# Patient Record
Sex: Male | Born: 1980 | Hispanic: No | Marital: Married | Smoking: Never smoker
Health system: Southern US, Community
[De-identification: ages and names within clinical notes are randomized; demographics above are authoritative.]

## PROBLEM LIST (undated history)

## (undated) ENCOUNTER — Emergency Department (HOSPITAL_BASED_OUTPATIENT_CLINIC_OR_DEPARTMENT_OTHER): Admission: EM | Payer: BC Managed Care – PPO

## (undated) DIAGNOSIS — F339 Major depressive disorder, recurrent, unspecified: Secondary | ICD-10-CM

## (undated) DIAGNOSIS — K219 Gastro-esophageal reflux disease without esophagitis: Secondary | ICD-10-CM

## (undated) DIAGNOSIS — M4802 Spinal stenosis, cervical region: Secondary | ICD-10-CM

## (undated) HISTORY — DX: Major depressive disorder, recurrent, unspecified: F33.9

## (undated) HISTORY — DX: Gastro-esophageal reflux disease without esophagitis: K21.9

## (undated) HISTORY — DX: Spinal stenosis, cervical region: M48.02

---

## 2016-12-20 HISTORY — PX: ANTERIOR CERVICAL DECOMP/DISCECTOMY FUSION: SHX1161

## 2017-07-31 ENCOUNTER — Encounter: Payer: Self-pay | Admitting: Family Medicine

## 2017-07-31 ENCOUNTER — Ambulatory Visit (INDEPENDENT_AMBULATORY_CARE_PROVIDER_SITE_OTHER): Payer: BLUE CROSS/BLUE SHIELD | Admitting: Family Medicine

## 2017-07-31 DIAGNOSIS — F329 Major depressive disorder, single episode, unspecified: Secondary | ICD-10-CM | POA: Diagnosis not present

## 2017-07-31 DIAGNOSIS — M5010 Cervical disc disorder with radiculopathy, unspecified cervical region: Secondary | ICD-10-CM

## 2017-07-31 DIAGNOSIS — M542 Cervicalgia: Secondary | ICD-10-CM | POA: Diagnosis not present

## 2017-07-31 MED ORDER — PREDNISONE 10 MG PO TABS: ORAL_TABLET | ORAL | 0 refills | Status: DC

## 2017-07-31 MED FILL — predniSONE 10 MG TABS: 10 | 6 days supply | Qty: 21 | Fill #0

## 2017-07-31 NOTE — Patient Instructions (Addendum)
I'm concerned about you having a C7 radiculopathy. Take prednisone dose pack as directed for 6 days. Call me in a week to let me know how you're doing. Keep working with [place]. We will obtain the records from your other doctor. Continue the cymbalta, gabapentin and robaxin. If you have paperwork that needs to be filled out, drop this off. Establish care with [facility] so you have a primary care physician. It would also benefit you to work with one of their psychologists. If you feel like you're going to hurt yourself please call 911, go to the emergency department, call me.

## 2017-08-03 ENCOUNTER — Encounter: Payer: Self-pay | Admitting: Family Medicine

## 2017-08-03 DIAGNOSIS — M5010 Cervical disc disorder with radiculopathy, unspecified cervical region: Secondary | ICD-10-CM | POA: Insufficient documentation

## 2017-08-03 DIAGNOSIS — F329 Major depressive disorder, single episode, unspecified: Secondary | ICD-10-CM | POA: Insufficient documentation

## 2017-08-03 DIAGNOSIS — F32A Depression, unspecified: Secondary | ICD-10-CM | POA: Insufficient documentation

## 2017-08-03 NOTE — Assessment & Plan Note (Signed)
ROI filled out to obtain records from his neck surgeon and imaging reports.  Continue cymbalta, gabapentin, and robaxin.  Start prednisone dose pack, continue PT.  May need to repeat MRI of cervical spine but will see depending on records.

## 2017-08-03 NOTE — Assessment & Plan Note (Signed)
patient is taking cymbalta, gabapentin.  He endorsed feelings of SI - denied having a plan.  Taken to establish care with PCP across the hall - they also have psychology there for counseling.  Contracted to contact me, 911, seek care in ED if he feels like he's going to hurt himself.

## 2017-08-03 NOTE — Progress Notes (Signed)
PCP: James Sullivan, No Pcp Per  Subjective:   HPI: James Sullivan is a 37 y.o. male here for neck pain.  James Sullivan moved here from [place] about 1 month ago. He states approximately 1 year ago in August 2018 he had some pain in left side of neck when golfing. He developed pain radiating down his arm into thumb, index, middle fingers with numbness only on left. He had tried NSAIDs, ben gay. Tried PT after this then ESIs without improvement. Had surgery - some records available - likely C5-6 level fusion. ROI filled out today to get remainder of records. He lost his voice after surgery and needed speech therapy. Suffering from depression and insomnia. He's taking cymbalta 60mg  daily, gabapentin 600 tid, robaxin 750 tid. Still having pain left side of neck and upper back with radiation and numbness into his left index finger. Has done two visits in PT. Recent numbness in right 5th digit as well. No bowel/bladder dysfunction.  History reviewed. No pertinent past medical history.  No current outpatient medications on file prior to visit.   No current facility-administered medications on file prior to visit.     History reviewed. No pertinent surgical history.  No Known Allergies  Social History   Socioeconomic History  . Marital status: Married    Spouse name: Not on file  . Number of children: Not on file  . Years of education: Not on file  . Highest education level: Not on file  Occupational History  . Not on file  Social Needs  . Financial resource strain: Not on file  . Food insecurity:    Worry: Not on file    Inability: Not on file  . Transportation needs:    Medical: Not on file    Non-medical: Not on file  Tobacco Use  . Smoking status: Never Smoker  . Smokeless tobacco: Never Used  Substance and Sexual Activity  . Alcohol use: Not on file  . Drug use: Not on file  . Sexual activity: Not on file  Lifestyle  . Physical activity:    Days per week: Not on file    Minutes  per session: Not on file  . Stress: Not on file  Relationships  . Social connections:    Talks on phone: Not on file    Gets together: Not on file    Attends religious service: Not on file    Active member of club or organization: Not on file    Attends meetings of clubs or organizations: Not on file    Relationship status: Not on file  . Intimate partner violence:    Fear of current or ex partner: Not on file    Emotionally abused: Not on file    Physically abused: Not on file    Forced sexual activity: Not on file  Other Topics Concern  . Not on file  Social History Narrative  . Not on file    History reviewed. No pertinent family history.  BP 100/66   Pulse 63   Ht 5\' 6"  (1.676 m)   Wt 154 lb 6.4 oz (70 kg)   BMI 24.92 kg/m   Review of Systems: See HPI above.     Objective:  Physical Exam:  Gen: NAD, comfortable in exam room  Neck: No gross deformity, swelling, bruising. TTP left cervical paraspinal region and diffusely posterior left shoulder.  No midline/bony TTP. ROM limited to 10 degrees left lat rotation, 20 right lat rotation, 10 flexion, 5 extension. BUE strength  5/5 except 4/5 and painful with wrist extension, elbow flexion and extension, limited by pain.   Sensation diminished to light touch left index finger, right 5th digit - no other lack of sensation. 2+ equal reflexes in triceps, biceps, brachioradialis tendons. 2+ radial pulses.  Left shoulder: No swelling, ecchymoses.  No gross deformity. No TTP. FROM. Strength 5/5 with resisted internal/external rotation. NV intact distally.   Assessment & Plan:  1. Neck pain with radiation into left arm - 2/2 cervical radiculopathy.  ROI filled out to obtain records from his neck surgeon and imaging reports.  Continue cymbalta, gabapentin, and robaxin.  Start prednisone dose pack, continue PT.  May need to repeat MRI of cervical spine but will see depending on records.  2. Depression - James Sullivan is taking  cymbalta, gabapentin.  He endorsed feelings of SI - denied having a plan.  Taken to establish care with PCP across the hall - they also have psychology there for counseling.  Contracted to contact me, 911, seek care in ED if he feels like he's going to hurt himself.

## 2017-08-05 ENCOUNTER — Telehealth: Payer: Self-pay | Admitting: Family Medicine

## 2017-08-05 NOTE — Telephone Encounter (Signed)
Called to inform patient of his records being faxed to [facility] per patient request and patient wanted to update you that he is still in pain. He states he has not seen relief from the prednisone and it is causing him to lose sleep. He is taking melatonin to try and help him sleep.

## 2017-08-05 NOTE — Telephone Encounter (Signed)
Patient was informed and will call back after he finishes the prednisone

## 2017-08-05 NOTE — Telephone Encounter (Signed)
That's disappointing.  I'd like to see his records which we haven't received yet but it's likely we will have to repeat his imaging.  Concern is he has a pinched nerve below the level where he had surgery.  Ask him to finish out his prednisone.  Continue cymbalta, gabapentin, robaxin.  He can use salon pas patches and capsaicin too (both things he can get over the counter).

## 2017-08-20 ENCOUNTER — Encounter: Payer: Self-pay | Admitting: Family Medicine

## 2017-08-20 ENCOUNTER — Ambulatory Visit (INDEPENDENT_AMBULATORY_CARE_PROVIDER_SITE_OTHER): Payer: BLUE CROSS/BLUE SHIELD | Admitting: Family Medicine

## 2017-08-20 VITALS — BP 124/66 | HR 81 | Temp 98.2°F | Ht 66.0 in | Wt 156.2 lb

## 2017-08-20 DIAGNOSIS — F331 Major depressive disorder, recurrent, moderate: Secondary | ICD-10-CM | POA: Insufficient documentation

## 2017-08-20 DIAGNOSIS — M4802 Spinal stenosis, cervical region: Secondary | ICD-10-CM | POA: Insufficient documentation

## 2017-08-20 MED ORDER — OMEPRAZOLE 40 MG PO CPDR: 40.0000 mg | DELAYED_RELEASE_CAPSULE | Freq: Every day | ORAL | 1 refills | Status: DC

## 2017-08-20 MED ORDER — METHOCARBAMOL 750 MG PO TABS
750.0000 mg | ORAL_TABLET | Freq: Three times a day (TID) | ORAL | 1 refills | Status: DC | PRN
Start: 2017-08-20 — End: 2017-12-09

## 2017-08-20 MED ORDER — GABAPENTIN 600 MG PO TABS: 600.0000 mg | ORAL_TABLET | Freq: Every day | ORAL | 1 refills | Status: DC

## 2017-08-20 MED ORDER — ESCITALOPRAM OXALATE 10 MG PO TABS: 10.0000 mg | ORAL_TABLET | Freq: Every day | ORAL | 3 refills | Status: DC

## 2017-08-20 NOTE — Patient Instructions (Signed)
Please consider counseling. Contact [phone] to schedule an appointment or inquire about cost/insurance coverage.  Stay active. Yoga. Water therapy.  Ice/cold pack over area for 10-15 min twice daily.  Heat (pad or rice pillow in microwave) over affected area, 10-15 minutes twice daily.   Let us know if you need anything.  Trapezius stretches/exercises Do exercises exactly as told by your health care provider and adjust them as directed. It is normal to feel mild stretching, pulling, tightness, or discomfort as you do these exercises, but you should stop right away if you feel sudden pain or your pain gets worse.  Stretching and range of motion exercises These exercises warm up your muscles and joints and improve the movement and flexibility of your shoulder. These exercises can also help to relieve pain, numbness, and tingling. If you are unable to do any of the following for any reason, do not further attempt to do it.   Exercise A: Flexion, standing    1. Stand and hold a broomstick, a cane, or a similar object. Place your hands a little more than shoulder-width apart on the object. Your left / right hand should be palm-up, and your other hand should be palm-down. 2. Push the stick to raise your left / right arm out to your side and then over your head. Use your other hand to help move the stick. Stop when you feel a stretch in your shoulder, or when you reach the angle that is recommended by your health care provider. ? Avoid shrugging your shoulder while you raise your arm. Keep your shoulder blade tucked down toward your spine. 3. Hold for 30 seconds. 4. Slowly return to the starting position. Repeat 2 times. Complete this exercise 3 times per week.  Exercise B: Abduction, supine    1. Lie on your back and hold a broomstick, a cane, or a similar object. Place your hands a little more than shoulder-width apart on the object. Your left / right hand should be palm-up, and your  other hand should be palm-down. 2. Push the stick to raise your left / right arm out to your side and then over your head. Use your other hand to help move the stick. Stop when you feel a stretch in your shoulder, or when you reach the angle that is recommended by your health care provider. ? Avoid shrugging your shoulder while you raise your arm. Keep your shoulder blade tucked down toward your spine. 3. Hold for 30 seconds. 4. Slowly return to the starting position. Repeat 2 times. Complete this exercise 3 times per week.  Exercise C: Flexion, active-assisted    1. Lie on your back. You may bend your knees for comfort. 2. Hold a broomstick, a cane, or a similar object. Place your hands about shoulder-width apart on the object. Your palms should face toward your feet. 3. Raise the stick and move your arms over your head and behind your head, toward the floor. Use your healthy arm to help your left / right arm move farther. Stop when you feel a gentle stretch in your shoulder, or when you reach the angle where your health care provider tells you to stop. 4. Hold for 30 seconds. 5. Slowly return to the starting position. Repeat 2 times. Complete this exercise 3 times per week.  Exercise D: External rotation and abduction    1. Stand in a door frame with one of your feet slightly in front of the other. This is called a staggered  stance. 2. Choose one of the following positions as told by your health care provider: ? Place your hands and forearms on the door frame above your head. ? Place your hands and forearms on the door frame at the height of your head. ? Place your hands on the door frame at the height of your elbows. 3. Slowly move your weight onto your front foot until you feel a stretch across your chest and in the front of your shoulders. Keep your head and chest upright and keep your abdominal muscles tight. 4. Hold for 30 seconds. 5. To release the stretch, shift your weight to  your back foot. Repeat 2 times. Complete this stretch 3 times per week.  Strengthening exercises These exercises build strength and endurance in your shoulder. Endurance is the ability to use your muscles for a long time, even after your muscles get tired. Exercise E: Scapular depression and adduction  1. Sit on a stable chair. Support your arms in front of you with pillows, armrests, or a tabletop. Keep your elbows in line with the sides of your body. 2. Gently move your shoulder blades down toward your middle back. Relax the muscles on the tops of your shoulders and in the back of your neck. 3. Hold for 3 seconds. 4. Slowly release the tension and relax your muscles completely before doing this exercise again. Repeat for a total of 10 repetitions. 5. After you have practiced this exercise, try doing the exercise without the arm support. Then, try the exercise while standing instead of sitting. Repeat 2 times. Complete this exercise 3 times per week.  Exercise F: Shoulder abduction, isometric    1. Stand or sit about 4-6 inches (10-15 cm) from a wall with your left / right side facing the wall. 2. Bend your left / right elbow and gently press your elbow against the wall. 3. Increase the pressure slowly until you are pressing as hard as you can without shrugging your shoulder. 4. Hold for 3 seconds. 5. Slowly release the tension and relax your muscles completely. Repeat for a total of 10 repetitions. Repeat 2 times. Complete this exercise 3 times per week.  Exercise G: Shoulder flexion, isometric    1. Stand or sit about 4-6 inches (10-15 cm) away from a wall with your left / right side facing the wall. 2. Keep your left / right elbow straight and gently press the top of your fist against the wall. Increase the pressure slowly until you are pressing as hard as you can without shrugging your shoulder. 3. Hold for 10-15 seconds. 4. Slowly release the tension and relax your muscles  completely. Repeat for a total of 10 repetitions. Repeat 2 times. Complete this exercise 3 times per week.  Exercise H: Internal rotation    1. Sit in a stable chair without armrests, or stand. Secure an exercise band at your left / right side, at elbow height. 2. Place a soft object, such as a folded towel or a small pillow, under your left / right upper arm so your elbow is a few inches (about 8 cm) away from your side. 3. Hold the end of the exercise band so the band stretches. 4. Keeping your elbow pressed against the soft object under your arm, move your forearm across your body toward your abdomen. Keep your body steady so the movement is only coming from your shoulder. 5. Hold for 3 seconds. 6. Slowly return to the starting position. Repeat for a total  of 10 repetitions. Repeat 2 times. Complete this exercise 3 times per week.  Exercise I: External rotation    1. Sit in a stable chair without armrests, or stand. 2. Secure an exercise band at your left / right side, at elbow height. 3. Place a soft object, such as a folded towel or a small pillow, under your left / right upper arm so your elbow is a few inches (about 8 cm) away from your side. 4. Hold the end of the exercise band so the band stretches. 5. Keeping your elbow pressed against the soft object under your arm, move your forearm out, away from your abdomen. Keep your body steady so the movement is only coming from your shoulder. 6. Hold for 3 seconds. 7. Slowly return to the starting position. Repeat for a total of 10 repetitions. Repeat 2 times. Complete this exercise 3 times per week. Exercise J: Shoulder extension  1. Sit in a stable chair without armrests, or stand. Secure an exercise band to a stable object in front of you so the band is at shoulder height. 2. Hold one end of the exercise band in each hand. Your palms should face each other. 3. Straighten your elbows and lift your hands up to shoulder  height. 4. Step back, away from the secured end of the exercise band, until the band stretches. 5. Squeeze your shoulder blades together and pull your hands down to the sides of your thighs. Stop when your hands are straight down by your sides. Do not let your hands go behind your body. 6. Hold for 3 seconds. 7. Slowly return to the starting position. Repeat for a total of 10 repetitions. Repeat 2 times. Complete this exercise 3 times per week.  Exercise K: Shoulder extension, prone    1. Lie on your abdomen on a firm surface so your left / right arm hangs over the edge. 2. Hold a 5 lb weight in your hand so your palm faces in toward your body. Your arm should be straight. 3. Squeeze your shoulder blade down toward the middle of your back. 4. Slowly raise your arm behind you, up to the height of the surface that you are lying on. Keep your arm straight. 5. Hold for 3 seconds. 6. Slowly return to the starting position and relax your muscles. Repeat for a total of 10 repetitions. Repeat 2 times. Complete this exercise 3 times per week.   Exercise L: Horizontal abduction, prone  1. Lie on your abdomen on a firm surface so your left / right arm hangs over the edge. 2. Hold a 5 lb weight in your hand so your palm faces toward your feet. Your arm should be straight. 3. Squeeze your shoulder blade down toward the middle of your back. 4. Bend your elbow so your hand moves up, until your elbow is bent to an "L" shape (90 degrees). With your elbow bent, slowly move your forearm forward and up. Raise your hand up to the height of the surface that you are lying on. ? Your upper arm should not move, and your elbow should stay bent. ? At the top of the movement, your palm should face the floor. 5. Hold for 3 seconds. 6. Slowly return to the starting position and relax your muscles. Repeat for a total of 10 repetitions. Repeat 2 times. Complete this exercise 3 times per week.  Exercise M: Horizontal  abduction, standing  1. Sit on a stable chair, or stand. 2. Secure  an exercise band to a stable object in front of you so the band is at shoulder height. 3. Hold one end of the exercise band in each hand. 4. Straighten your elbows and lift your hands straight in front of you, up to shoulder height. Your palms should face down, toward the floor. 5. Step back, away from the secured end of the exercise band, until the band stretches. 6. Move your arms out to your sides, and keep your arms straight. 7. Hold for 3 seconds. 8. Slowly return to the starting position. Repeat for a total of 10 repetitions. Repeat 2 times. Complete this exercise 3 times per week.  Exercise N: Scapular retraction and elevation  1. Sit on a stable chair, or stand. 2. Secure an exercise band to a stable object in front of you so the band is at shoulder height. 3. Hold one end of the exercise band in each hand. Your palms should face each other. 4. Sit in a stable chair without armrests, or stand. 5. Step back, away from the secured end of the exercise band, until the band stretches. 6. Squeeze your shoulder blades together and lift your hands over your head. Keep your elbows straight. 7. Hold for 3 seconds. 8. Slowly return to the starting position. Repeat for a total of 10 repetitions. Repeat 2 times. Complete this exercise 3 times per week.  This information is not intended to replace advice given to you by your health care provider. Make sure you discuss any questions you have with your health care provider. Document Released: 01/06/2005 Document Revised: 09/13/2015 Document Reviewed: 11/23/2014 Elsevier Interactive Patient Education  2017 [employer].

## 2017-08-20 NOTE — Progress Notes (Signed)
Chief Complaint  Patient presents with  . New Patient (Initial Visit)       New Patient Visit SUBJECTIVE: HPI: James Sullivan is an 37 y.o.male who is being seen for establishing care.  The patient was previously seen at office in [place].  Hx of severe c spine stenosis. Failed conservative therapy, eventually had a laminectomy that helped a little. Having a lot of upper axial pain and LUE weakness/numbness/tingling/pain. Was told this could persist up to 18 mo post op. It has been aorund 8 mo since surg. Failed tramadol, hydrocodone, Cymbalta. Currently on Robaxin, Gabapentin. Going thru PT. Not following with NS in area. Requesting MRI for follow up given persistent weakness/numbness/pain.   Hx of depression also.    No Known Allergies  Past Medical History:  Diagnosis Date  . GERD (gastroesophageal reflux disease)    History reviewed. No pertinent surgical history.   Family History  Problem Relation Age of Onset  . Narcolepsy Mother   . Anxiety disorder Mother   . Depression Mother    No Known Allergies  Current Outpatient Medications:  Marland Kitchen  Melatonin 10 MG CAPS, Take 1 capsule by mouth at bedtime., Disp: , Rfl:  .  methocarbamol (ROBAXIN) 750 MG tablet, Take 1 tablet (750 mg total) by mouth every 8 (eight) hours as needed for muscle spasms. Patient currently taking twice daily, Disp: 180 tablet, Rfl: 1 .  omeprazole (PRILOSEC) 40 MG capsule, Take 1 capsule (40 mg total) by mouth daily., Disp: 90 capsule, Rfl: 1 .  escitalopram (LEXAPRO) 10 MG tablet, Take 1 tablet (10 mg total) by mouth daily., Disp: 30 tablet, Rfl: 3 .  gabapentin (NEURONTIN) 600 MG tablet, Take 1 tablet (600 mg total) by mouth at bedtime., Disp: 270 tablet, Rfl: 1  ROS 10 pt ROS neg unless otherwise noted in HPI  OBJECTIVE: BP 124/66 (BP Location: Right Arm, Patient Position: Sitting, Cuff Size: Normal)   Pulse 81   Temp 98.2 F (36.8 C) (Oral)   Ht 5\' 6"  (1.676 m)   Wt 156 lb 4 oz (70.9 kg)    SpO2 97%   BMI 25.22 kg/m   Constitutional: -  VS reviewed -  Well developed, well nourished, appears stated age -  No apparent distress  Psychiatric: -  Oriented to person, place, and time -  Memory intact -  Flat affect, dysthymic mood -  Fluent conversation, good eye contact -  Judgment and insight age appropriate  Eye: -  Conjunctivae clear, no discharge -  Pupils symmetric, round, reactive to light  ENMT: -  MMM    Pharynx moist, no exudate, no erythema  Neck: -  No gross swelling, no palpable masses -  Thyroid midline, not enlarged, mobile, no palpable masses  Cardiovascular: -  RRR -  No LE edema  Respiratory: -  Normal respiratory effort, no accessory muscle use, no retraction -  Breath sounds equal, no wheezes, no ronchi, no crackles  Gastrointestinal: -  Bowel sounds normal -  No tenderness, no distention, no guarding, no masses  Neurological:  -  CN II - XII grossly intact -  Decreased sensation to pinprick over L hand, especially 2nd digit.  -  No cerebellar signs -  Neg Spurling's though it did elicit pain -  4/5 strength with ext/int rotation of shoulder and flex/ext of elbow on L -  5/5 strength on RUE  Musculoskeletal: -  No clubbing, no cyanosis -  Gait normal -  +TTP over traps and cerv  paraspinal msc b/l  Skin: -  No significant lesion on inspection -  Warm and dry to palpation   ASSESSMENT/PLAN: Moderate episode of recurrent major depressive disorder (HCC) - Plan: Melatonin 10 MG CAPS, escitalopram (LEXAPRO) 10 MG tablet  Cervical stenosis of spinal canal - Plan: methocarbamol (ROBAXIN) 750 MG tablet, gabapentin (NEURONTIN) 600 MG tablet, MR Cervical Spine Wo Contrast  Patient instructed to sign release of records form from his previous PCP. Will ck on MRI. Offered referral to NS, pt would like to await results as finances are tight with his many specialists. I will stop Cymbalta for him as it did not seem to be particularly helpful.  # for [facility]  provided. Might look into what options are offered by insurance as finances are an issue. Heat, ice, stretches/exercises for traps.  Patient should return in 6 weeks. The patient voiced understanding and agreement to the plan.   Jilda Rocheicholas Paul [place], DO 08/20/17  3:36 PM

## 2017-08-20 NOTE — Progress Notes (Signed)
Pre visit review using our clinic review tool, if applicable. No additional management support is needed unless otherwise documented below in the visit note. 

## 2017-08-21 ENCOUNTER — Encounter: Payer: Self-pay | Admitting: Family Medicine

## 2017-08-21 DIAGNOSIS — K219 Gastro-esophageal reflux disease without esophagitis: Secondary | ICD-10-CM | POA: Insufficient documentation

## 2017-08-21 DIAGNOSIS — F339 Major depressive disorder, recurrent, unspecified: Secondary | ICD-10-CM | POA: Insufficient documentation

## 2017-08-21 DIAGNOSIS — M542 Cervicalgia: Secondary | ICD-10-CM

## 2017-08-21 NOTE — Progress Notes (Unsigned)
Extensive notes received from patient's providers in [facility] and have been reviewed, summary with dates below:  09/04/16:  Office visit with Dr. Haywood Pao, orthopedic surgeon.  Pain, numbness in neck, left arm and shoulder, numbness into left and right arms.  Noted history of B12 deficiency with plans to refer him for evaluation and treatment of this.  Noted a remote EMG that was normal patient had previously for numbness in hands.  X-rays at current visit showed disc narrowing at C5-6 with reversal of lordosis.  Sensation diminished but no other objective findings on exam.  Plan: Physical therapy, referral for B12 deficiency  MRI also performed on this date, report to be scanned into chart.  Multilevel degenerative discopathy and spondylosis.  Moderate narrowing of C3-4 and C5-6 vertebral canal.  Severe narrowing of C6-7 left neural foramina (though other part of report states this is at C5-6).  Spinal cord contacted at C3-4 and C5-6 but no myelopathy.  Dr. Raeanne Gathers mentioned above typographic error and by his review severe narrowing on left is at C5-6, lesser degree at C6-7 - plan to schedule ESI for this on the left.  Notes indicate he is taking gabapentin, lyrica, tylenol #3, flexeril.  09/08/16: Started physical therapy  09/09/16:  Office visit with Dr. Benard Halsted, pain management for [facility] consultation.  Also notes disc issue is at C5-6 on the left.  He's using TENS unit, heating pad, topical creams.  Taking naproxen now as well.  Home TENS prescribed, set up to start ESIs.  09/10/16:  Office visit with Dr. Raeanne Gathers.  Plan to refer to spine surgeon if not improving with ESIs.  Plan:  Continue PT, go ahead with ESIs.  Continue flexeril, gabapentin, tylenol #3, gralise (extended release gabapentin).  Add medrol dose pack.  09/19/16:  ESI performed on left for C6 radiculopathy via T1-2 approach.  09/23/16:  Consultation with Dr. Eulah Citizen Velimirovic, neurosurgeon.  Injection didn't provide much relief (a  second injection at later dated reported to help for couple days only).  Notes indicate plan to do ACDF at C4-5 and C5-6.  10/01/16:  Office visit with Dr. Raeanne Gathers.  Notation he had an EMG showing C7-T1 radiculopathy (assuming left side?) but this test is not in records.  Care transferred to the neurosurgeon.  10/15/16:  Office visit with Dr. Carole Binning, internist.  Diagnosed with major depressive disorder, placed on duloxetine 30mg  daily and labwork ordered (only labwork in notes is from 8/21 showing normal CBC with diff except slightly high MCHC but normal B12, folic acid, and Vitamin D levels)  11/27/16:  Office visit with Dr. Donetta Potts.  Discussed back and FMLA.  Taking duloxetine 30mg , gralise 600mg , tylenol #3 as needed, flexeril 10mg  as needed, tizanidine 4mg  as needed.  01/09/17:  Office visit with Dr. Donetta Potts.  Ordered physical therapy again.  New Rx of robaxin 750mg  every 6 hours listed.  02/13/17:  Office visit with Dr. Donetta Potts.  Increase duloxetine to 60mg  daily.    03/24/17:  Office visit with Dr. Donetta Potts.  Neurosurgeon had advised gabapentin increase to 900mg   05/12/17:  Office visit with Dr. Donetta Potts.  Received IM toradol day before due to pain.  Reports he wishes to return to work.  Note:  After reviewing the above believe we need to go ahead with repeat x-rays then MRI of his cervical spine.  It's still not completely clear which levels he had fused.  And after 1 year of treatment he's still struggling with radicular symptoms despite extensive PT, 2  ESIs, surgical intervention, medications.

## 2017-08-27 ENCOUNTER — Ambulatory Visit (HOSPITAL_BASED_OUTPATIENT_CLINIC_OR_DEPARTMENT_OTHER)
Admission: RE | Admit: 2017-08-27 | Discharge: 2017-08-27 | Disposition: A | Discharge status: Home / Self Care | Payer: 59 | Source: Ambulatory Visit | Attending: Family Medicine | Admitting: Family Medicine

## 2017-08-27 DIAGNOSIS — M50321 Other cervical disc degeneration at C4-C5 level: Secondary | ICD-10-CM | POA: Insufficient documentation

## 2017-08-27 DIAGNOSIS — M4802 Spinal stenosis, cervical region: Secondary | ICD-10-CM | POA: Diagnosis not present

## 2017-08-27 DIAGNOSIS — M542 Cervicalgia: Secondary | ICD-10-CM | POA: Diagnosis present

## 2017-08-28 ENCOUNTER — Encounter: Payer: Self-pay | Admitting: Family Medicine

## 2017-08-28 ENCOUNTER — Other Ambulatory Visit: Payer: Self-pay | Admitting: Family Medicine

## 2017-08-28 DIAGNOSIS — M4802 Spinal stenosis, cervical region: Secondary | ICD-10-CM

## 2017-08-28 MED ORDER — GABAPENTIN 600 MG PO TABS: 600.0000 mg | ORAL_TABLET | Freq: Three times a day (TID) | ORAL | 1 refills | Status: DC

## 2017-08-31 ENCOUNTER — Other Ambulatory Visit: Payer: Self-pay | Admitting: Family Medicine

## 2017-08-31 MED ORDER — CYANOCOBALAMIN 1000 MCG/ML IJ SOLN: 1000.0000 ug | INTRAMUSCULAR | 0 refills | Status: DC

## 2017-08-31 MED ORDER — "SYRINGE/NEEDLE (DISP) 23G X 1"" 3 ML MISC": 0 refills | Status: DC

## 2017-09-01 NOTE — Telephone Encounter (Signed)
Spoke with patient by phone yesterday to discuss his condition, answer questions.  Encouraged to increase his gabapentin to 900 tid and potentially up to 1200mg  tid, let us know if he needs refills.  Went over his x-rays.  We have submitted for MRI of his cervical spine given persistent pain, weakness in extremity.  He also noted pain now into left leg, concern for possible myelopathy.

## 2017-09-04 ENCOUNTER — Encounter: Payer: Self-pay | Admitting: Family Medicine

## 2017-09-04 NOTE — Addendum Note (Signed)
Addended by: Kathi SimpersWISE, Rendon Howell F on: 09/04/2017 09:51 AM   Modules accepted: Orders

## 2017-09-09 ENCOUNTER — Ambulatory Visit
Admission: RE | Admit: 2017-09-09 | Discharge: 2017-09-09 | Disposition: A | Discharge status: Home / Self Care | Payer: 59 | Source: Ambulatory Visit | Attending: Family Medicine | Admitting: Family Medicine

## 2017-09-09 DIAGNOSIS — M542 Cervicalgia: Secondary | ICD-10-CM

## 2017-09-24 ENCOUNTER — Ambulatory Visit: Payer: 59 | Admitting: Family Medicine

## 2017-10-15 ENCOUNTER — Telehealth: Payer: Self-pay | Admitting: Family Medicine

## 2017-10-15 NOTE — Telephone Encounter (Signed)
We talked about eventually titrating up to 1200mg  three times a day but wouldn't recommend taking 4 times a day.  Does he want to try increasing to 900mg  three times a day for a week then up to 1200mg  three times a day.  I can send in rx if so.

## 2017-10-15 NOTE — Telephone Encounter (Signed)
Spoke to patient and he would like to increase the gabapentin.  Patient requesting Rx be sent to [facility] on [address]

## 2017-10-15 NOTE — Telephone Encounter (Signed)
Patient called to check on a Rx for Gabapentin. He is under the impression that a new prescription for Gabapentin is supposed to be sent in for 600mg  taking one tablet four times a day

## 2017-10-16 MED ORDER — GABAPENTIN 600 MG PO TABS: 1200.0000 mg | ORAL_TABLET | Freq: Three times a day (TID) | ORAL | 2 refills | Status: DC

## 2017-10-16 NOTE — Telephone Encounter (Signed)
Per DPR approval, a detailed message was left informing patient of Rx instructions. Instructed patient to return call with any questions

## 2017-10-16 NOTE — Telephone Encounter (Signed)
I sent in 600mg  tablets for him.  So he should take 1.5 tablets three times a day for a week then increase to 2 tablets three times a day.  Thanks!

## 2017-10-19 ENCOUNTER — Ambulatory Visit: Payer: 59 | Admitting: Family Medicine

## 2017-10-19 DIAGNOSIS — Z0289 Encounter for other administrative examinations: Secondary | ICD-10-CM

## 2017-11-03 ENCOUNTER — Encounter: Payer: Self-pay | Admitting: Family Medicine

## 2017-12-07 ENCOUNTER — Encounter: Payer: Self-pay | Admitting: Family Medicine

## 2017-12-07 ENCOUNTER — Ambulatory Visit (INDEPENDENT_AMBULATORY_CARE_PROVIDER_SITE_OTHER): Payer: BLUE CROSS/BLUE SHIELD | Admitting: Family Medicine

## 2017-12-07 VITALS — BP 102/68 | HR 66 | Temp 98.2°F | Ht 66.0 in | Wt 154.0 lb

## 2017-12-07 DIAGNOSIS — G8929 Other chronic pain: Secondary | ICD-10-CM

## 2017-12-07 DIAGNOSIS — J209 Acute bronchitis, unspecified: Secondary | ICD-10-CM | POA: Diagnosis not present

## 2017-12-07 DIAGNOSIS — M549 Dorsalgia, unspecified: Secondary | ICD-10-CM | POA: Diagnosis not present

## 2017-12-07 MED ORDER — LEVOFLOXACIN 750 MG PO TABS: 750.0000 mg | ORAL_TABLET | Freq: Every day | ORAL | 0 refills | Status: AC

## 2017-12-07 NOTE — Patient Instructions (Addendum)
Please consider counseling. Contact [phone] to schedule an appointment or inquire about cost/insurance coverage.  I think you have bronchitis. Most causes of bronchitis are viral in nature. If we prescribe antibiotics for viral illnesses, not only will it fail to help you get better, but has also been shown to do harm.  Wait 4-5 days prior to taking antibiotic.  Continue to push fluids, practice good hand hygiene, and cover your mouth if you cough.  If you start having fevers, shaking or shortness of breath, seek immediate care.  Give us 7 business days to get the results of your labs back.   Let us know if you need anything.
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
## 2017-12-08 ENCOUNTER — Encounter: Payer: Self-pay | Admitting: Family Medicine

## 2017-12-08 ENCOUNTER — Other Ambulatory Visit (INDEPENDENT_AMBULATORY_CARE_PROVIDER_SITE_OTHER): Payer: BLUE CROSS/BLUE SHIELD

## 2017-12-08 ENCOUNTER — Other Ambulatory Visit: Payer: Self-pay | Admitting: Family Medicine

## 2017-12-08 DIAGNOSIS — M549 Dorsalgia, unspecified: Secondary | ICD-10-CM | POA: Diagnosis not present

## 2017-12-08 LAB — VITAMIN D 25 HYDROXY (VIT D DEFICIENCY, FRACTURES): VITD: 17.4 ng/mL — ABNORMAL LOW (ref 30.00–100.00)

## 2017-12-08 MED ORDER — VITAMIN D (ERGOCALCIFEROL) 1.25 MG (50000 UNIT) PO CAPS: 50000.0000 [IU] | ORAL_CAPSULE | ORAL | 0 refills | Status: DC

## 2017-12-09 ENCOUNTER — Other Ambulatory Visit: Payer: Self-pay | Admitting: Family Medicine

## 2017-12-09 DIAGNOSIS — F331 Major depressive disorder, recurrent, moderate: Secondary | ICD-10-CM

## 2017-12-09 MED ORDER — TIZANIDINE HCL 6 MG PO CAPS: 6.0000 mg | ORAL_CAPSULE | Freq: Three times a day (TID) | ORAL | 1 refills | Status: DC

## 2017-12-09 MED ORDER — AMITRIPTYLINE HCL 10 MG PO TABS: 10.0000 mg | ORAL_TABLET | Freq: Every day | ORAL | 1 refills | Status: DC

## 2017-12-10 ENCOUNTER — Telehealth: Payer: Self-pay | Admitting: Family Medicine

## 2017-12-10 ENCOUNTER — Encounter: Payer: Self-pay | Admitting: Family Medicine

## 2017-12-10 LAB — ANA,IFA RA DIAG PNL W/RFLX TIT/PATN
Anti Nuclear Antibody(ANA): NEGATIVE
Cyclic Citrullin Peptide Ab: <16 UNITS

## 2017-12-10 LAB — HLA-B27 ANTIGEN: HLA-B27 Antigen: NEGATIVE

## 2017-12-10 LAB — ANTI-DNA ANTIBODY, DOUBLE-STRANDED: ds DNA Ab: 1 IU/mL

## 2017-12-10 LAB — ANTI-SMITH ANTIBODY: ENA SM Ab Ser-aCnc: <1 AI

## 2017-12-10 NOTE — Telephone Encounter (Signed)
Spoke to patient and this has been taken care of already.

## 2017-12-10 NOTE — Telephone Encounter (Signed)
Copied from CRM [phone]. Topic: General - Other >> Dec 10, 2017 10:45 AM Tamela OddiHarris, Brenda J wrote: Reason for CRM: Patient called to speak with the nurse regarding his labs and also a not for his employer.  Please advise and call patient back at [phone]   Patient was advised by PCP on mychart of results/had just seen PCP message while on the phone. 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 NEW NOTE::: How may hours per week allowed to work will be-- 20 per week with 6 hour intervals per day. Advise if ok to write. Patient understood once new note done can download through mychart//willl print hardcopy//put at the front desk too.

## 2017-12-15 ENCOUNTER — Other Ambulatory Visit: Payer: Self-pay | Admitting: Family Medicine

## 2017-12-15 MED ORDER — AMITRIPTYLINE HCL 10 MG PO TABS: 10.0000 mg | ORAL_TABLET | Freq: Every day | ORAL | 1 refills | Status: DC

## 2017-12-17 ENCOUNTER — Encounter: Payer: Self-pay | Admitting: Family Medicine

## 2017-12-17 DIAGNOSIS — M5010 Cervical disc disorder with radiculopathy, unspecified cervical region: Secondary | ICD-10-CM

## 2017-12-17 DIAGNOSIS — G4700 Insomnia, unspecified: Secondary | ICD-10-CM

## 2017-12-28 ENCOUNTER — Telehealth: Payer: Self-pay | Admitting: Family Medicine

## 2017-12-28 ENCOUNTER — Encounter: Payer: Self-pay | Admitting: Family Medicine

## 2017-12-28 DIAGNOSIS — Z0279 Encounter for issue of other medical certificate: Secondary | ICD-10-CM

## 2017-12-28 NOTE — Telephone Encounter (Signed)
Received from [facility] [employer]omodation Request paperwork/ PCP completed. Copied/for scan/put on Shannon's desk for charge. Called the patient to pickup original at the front desk at convenience

## 2017-12-28 NOTE — Telephone Encounter (Signed)
Pt dropped off accomodation form. He wants Zella BallRobin to give him a call today [phone]. I put forms in Dr. Carmelia RollerWendling bin in the front office.

## 2017-12-30 ENCOUNTER — Encounter: Payer: Self-pay | Admitting: Family Medicine

## 2017-12-30 ENCOUNTER — Ambulatory Visit (INDEPENDENT_AMBULATORY_CARE_PROVIDER_SITE_OTHER): Payer: BLUE CROSS/BLUE SHIELD | Admitting: Psychology

## 2017-12-30 DIAGNOSIS — F321 Major depressive disorder, single episode, moderate: Secondary | ICD-10-CM

## 2017-12-31 ENCOUNTER — Telehealth: Payer: Self-pay | Admitting: Family Medicine

## 2017-12-31 NOTE — Telephone Encounter (Signed)
Pt dropped off documents for Dr. Carmelia RollerWendling to update, pt states he will pick up when ready. Documents placed in Dr. Carmelia RollerWendling tray at front office

## 2018-01-01 NOTE — Telephone Encounter (Signed)
Form received by PCP and he completed. Have called the patient this morning 01/01/18 to pickup at his convenience (had to leave a detailed message). Copied original/sent to scan. Put in envelope at the front desk for pickup.

## 2018-01-07 ENCOUNTER — Ambulatory Visit (INDEPENDENT_AMBULATORY_CARE_PROVIDER_SITE_OTHER): Payer: BLUE CROSS/BLUE SHIELD | Admitting: Psychology

## 2018-01-07 DIAGNOSIS — F321 Major depressive disorder, single episode, moderate: Secondary | ICD-10-CM | POA: Diagnosis not present

## 2018-01-14 ENCOUNTER — Telehealth: Payer: Self-pay | Admitting: *Deleted

## 2018-01-14 NOTE — Telephone Encounter (Signed)
Received Physician Orders from [facility]; forwarded to provider/SLS 12/26

## 2018-01-23 ENCOUNTER — Encounter: Payer: Self-pay | Admitting: Family Medicine

## 2018-02-02 ENCOUNTER — Encounter: Payer: Self-pay | Admitting: Family Medicine

## 2018-02-03 ENCOUNTER — Telehealth: Payer: Self-pay | Admitting: Family Medicine

## 2018-02-03 ENCOUNTER — Encounter: Payer: Self-pay | Admitting: Family Medicine

## 2018-02-03 DIAGNOSIS — M549 Dorsalgia, unspecified: Secondary | ICD-10-CM

## 2018-02-03 NOTE — Telephone Encounter (Signed)
Patient came by and says that his HR department at work lost some his paperwork that was once filled out for his accommodation for work form. He brought back the papers that HR lost that need to be filled out again. The papers will be in Dr. Hollie Beach box at the front

## 2018-02-03 NOTE — Telephone Encounter (Signed)
Completed as much as possible; forwarded to provider/SLS 01/15

## 2018-02-03 NOTE — Telephone Encounter (Signed)
OK TY

## 2018-02-03 NOTE — Telephone Encounter (Signed)
Copied from CRM [id number]. Topic: Referral - Request for Referral >> Feb 03, 2018 11:02 AM Darletta Moll L wrote: Has patient seen PCP for this complaint? Yes.   *If NO, is insurance requiring patient see PCP for this issue before PCP can refer them? Referral for which specialty: Neurosurgery  Preferred provider/office: Dr. Consuelo Pandy Neurosurgery Reason for referral: spinal pain   Referral done

## 2018-02-04 ENCOUNTER — Ambulatory Visit: Payer: BLUE CROSS/BLUE SHIELD | Admitting: Psychology

## 2018-02-05 ENCOUNTER — Ambulatory Visit: Payer: BLUE CROSS/BLUE SHIELD | Admitting: Psychology

## 2018-02-07 MED ORDER — PREDNISONE 10 MG PO TABS: ORAL_TABLET | ORAL | 0 refills | Status: DC

## 2018-02-07 MED ORDER — AMITRIPTYLINE HCL 25 MG PO TABS: 25.0000 mg | ORAL_TABLET | Freq: Every day | ORAL | 1 refills | Status: DC

## 2018-02-07 NOTE — Addendum Note (Signed)
Addended by: Lenda Kelp on: 02/07/2018 09:31 PM   Modules accepted: Orders

## 2018-02-12 ENCOUNTER — Emergency Department (HOSPITAL_BASED_OUTPATIENT_CLINIC_OR_DEPARTMENT_OTHER)
Admission: EM | Admit: 2018-02-12 | Discharge: 2018-02-12 | Disposition: A | Discharge status: Home / Self Care | Payer: BLUE CROSS/BLUE SHIELD | Attending: Emergency Medicine | Admitting: Emergency Medicine

## 2018-02-12 ENCOUNTER — Encounter: Payer: Self-pay | Admitting: Internal Medicine

## 2018-02-12 ENCOUNTER — Encounter (HOSPITAL_BASED_OUTPATIENT_CLINIC_OR_DEPARTMENT_OTHER): Payer: Self-pay | Admitting: *Deleted

## 2018-02-12 ENCOUNTER — Ambulatory Visit (INDEPENDENT_AMBULATORY_CARE_PROVIDER_SITE_OTHER): Payer: BLUE CROSS/BLUE SHIELD | Admitting: Internal Medicine

## 2018-02-12 ENCOUNTER — Ambulatory Visit (INDEPENDENT_AMBULATORY_CARE_PROVIDER_SITE_OTHER): Payer: BLUE CROSS/BLUE SHIELD | Admitting: Psychology

## 2018-02-12 ENCOUNTER — Other Ambulatory Visit: Payer: Self-pay

## 2018-02-12 VITALS — BP 120/76 | HR 78 | Temp 98.2°F | Resp 16 | Ht 66.0 in | Wt 157.1 lb

## 2018-02-12 DIAGNOSIS — F329 Major depressive disorder, single episode, unspecified: Secondary | ICD-10-CM

## 2018-02-12 DIAGNOSIS — Z79899 Other long term (current) drug therapy: Secondary | ICD-10-CM | POA: Diagnosis not present

## 2018-02-12 DIAGNOSIS — M5442 Lumbago with sciatica, left side: Secondary | ICD-10-CM | POA: Insufficient documentation

## 2018-02-12 DIAGNOSIS — G4700 Insomnia, unspecified: Secondary | ICD-10-CM

## 2018-02-12 DIAGNOSIS — R45851 Suicidal ideations: Secondary | ICD-10-CM | POA: Diagnosis not present

## 2018-02-12 DIAGNOSIS — M5441 Lumbago with sciatica, right side: Secondary | ICD-10-CM | POA: Diagnosis not present

## 2018-02-12 DIAGNOSIS — F321 Major depressive disorder, single episode, moderate: Secondary | ICD-10-CM | POA: Diagnosis not present

## 2018-02-12 DIAGNOSIS — M545 Low back pain: Secondary | ICD-10-CM | POA: Diagnosis present

## 2018-02-12 MED ORDER — ZOLPIDEM TARTRATE 5 MG PO TABS: 5.0000 mg | ORAL_TABLET | Freq: Every evening | ORAL | 0 refills | Status: DC | PRN

## 2018-02-12 NOTE — ED Notes (Addendum)
Pt has 2 bags in pt belonging cabinet  Pt in burgundy scrubs  All drawers and doors in room 12 locked
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
## 2018-02-12 NOTE — Patient Instructions (Signed)
Please go to the ER at the first floor 

## 2018-02-12 NOTE — ED Notes (Signed)
Pt sent to ed from his md upstairs for SI,  Pt denies SI  States he is just here for insomnia  He is not SI  States he had SI awhile back but not at present

## 2018-02-12 NOTE — Discharge Instructions (Addendum)
If you develop worsening, recurrent, or continued back pain, numbness or weakness in the legs, incontinence of your bowels or bladders, numbness of your buttocks, fever, abdominal pain, or any other new/concerning symptoms then return to the ER for evaluation.   If you feel thoughts of wanting to hurt or kill yourself, any one else, or have any other new/concerning symptoms then return to the ER for evaluation.

## 2018-02-12 NOTE — ED Triage Notes (Signed)
He has not been able to sleep x 1 week. Pain in his back legs and groin.

## 2018-02-12 NOTE — ED Provider Notes (Signed)
[facility] EMERGENCY DEPARTMENT Provider Note   CSN: [id number] Arrival date & time: 02/12/18  1444     History   Chief Complaint Chief Complaint  Patient presents with  . SI    HPI James Sullivan is a 38 y.o. male.  HPI  38 year old male sent from PCPs office after endorsing suicidal thoughts.  Patient tells me that this is not true.  He states that he was seeing his psychologist today and answers yes when asked if he occasionally has SI.  However he tells me it has not been recently and the last time he had SI was about a month ago.  He has no plan for hurting himself.  Psychologist offered admission to behavioral health and/or outpatient treatment.  The patient went to his PCP to help with insomnia for the last 1 week.  Due to the insomnia, PCP asked if he has had SI and he answered that he has but never was able to state when and was told to come straight here.  The patient repeatedly denies suicidal thoughts or plan.  He states he thinks his insomnia of the last 1-2 weeks is partially pain related given he is been having 2+ weeks of low back pain radiating down his anterior thighs.  No weakness or numbness.  The low back pain is not new but the radiation is new.  Over the last 5 days he has been on steroid treatment.  Is also taking Tylenol and gabapentin.  He has been trying melatonin for the sleep.  No current incontinence of his bowels or bladders.  Past Medical History:  Diagnosis Date  . Cervical stenosis of spinal canal   . GERD (gastroesophageal reflux disease)   . Major depression, recurrent [facility](HCC)     Patient Active Problem List   Diagnosis Date Noted  . Chronic midline back pain 12/07/2017  . Major depression, recurrent (HCC)   . GERD (gastroesophageal reflux disease)   . Cervical stenosis of spinal canal 08/20/2017  . Moderate episode of recurrent major depressive disorder (HCC) 08/20/2017  . Depression 08/03/2017  . Cervical disc disorder with  radiculopathy of cervical region 08/03/2017    Past Surgical History:  Procedure Laterality Date  . ANTERIOR CERVICAL DECOMP/DISCECTOMY FUSION  12/2016        Home Medications    Prior to Admission medications   Medication Sig Start Date End Date Taking? Authorizing Provider  amitriptyline (ELAVIL) 25 MG tablet Take 1 tablet (25 mg total) by mouth at bedtime. 02/07/18   Hudnall, Azucena FallenShane R, MD  cyanocobalamin (,VITAMIN B-12,) 1000 MCG/ML injection Inject 1 mL (1,000 mcg total) into the muscle every 30 (thirty) days. 08/31/17   Sharlene DoryWendling, Nicholas Paul, DO  gabapentin (NEURONTIN) 600 MG tablet Take 2 tablets (1,200 mg total) by mouth 3 (three) times daily. 10/16/17   Hudnall, Azucena FallenShane R, MD  Melatonin 10 MG CAPS Take 1 capsule by mouth at bedtime.    [provider]  omeprazole (PRILOSEC) 40 MG capsule Take 1 capsule (40 mg total) by mouth daily. 08/20/17   Sharlene DoryWendling, Nicholas Paul, DO  predniSONE (DELTASONE) 10 MG tablet 6 tabs po day 1, 5 tabs po day 2, 4 tabs po day 3, 3 tabs po day 4, 2 tabs po day 5, 1 tab po day 6 02/07/18   Hudnall, Azucena FallenShane R, MD  SYRINGE-NEEDLE, DISP, 3 ML 23G X 1" 3 ML MISC To use with Vitamin B12 injection 08/31/17   Wendling, Jilda RocheNicholas Paul, DO  tizanidine (ZANAFLEX)  6 MG capsule Take 1 capsule (6 mg total) by mouth 3 (three) times daily. 12/09/17   Sharlene DoryWendling, Nicholas Paul, DO  Vitamin D, Ergocalciferol, (DRISDOL) 1.25 MG (50000 UT) CAPS capsule Take 1 capsule (50,000 Units total) by mouth every 7 (seven) days. 12/08/17   Sharlene DoryWendling, Nicholas Paul, DO  zolpidem (AMBIEN) 5 MG tablet Take 1 tablet (5 mg total) by mouth at bedtime as needed for sleep. 02/12/18   Pricilla LovelessGoldston, Tosh Glaze, MD    Family History Family History  Problem Relation Age of Onset  . Narcolepsy Mother   . Anxiety disorder Mother   . Depression Mother   . Early death Father     Social History Social History   Tobacco Use  . Smoking status: Never Smoker  . Smokeless tobacco: Never Used  Substance Use  Topics  . Alcohol use: Not on file  . Drug use: Not on file     Allergies   Patient has no known allergies.   Review of Systems Review of Systems  Gastrointestinal: Negative for abdominal pain.  Musculoskeletal: Positive for back pain.  Neurological: Negative for weakness and numbness.  Psychiatric/Behavioral: Positive for sleep disturbance. Negative for suicidal ideas.  All other systems reviewed and are negative.    Physical Exam Updated Vital Signs BP 110/80   Pulse 71   Temp 99.2 F (37.3 C) (Oral)   Resp 16   Ht 5\' 6"  (1.676 m)   Wt 71.2 kg   SpO2 99%   BMI 25.34 kg/m   Physical Exam Vitals signs and nursing note reviewed.  Constitutional:      General: He is not in acute distress.    Appearance: He is well-developed. He is not ill-appearing, toxic-appearing or diaphoretic.  HENT:     Head: Normocephalic and atraumatic.     Right Ear: External ear normal.     Left Ear: External ear normal.     Nose: Nose normal.  Eyes:     General:        Right eye: No discharge.        Left eye: No discharge.  Neck:     Musculoskeletal: Neck supple.  Cardiovascular:     Rate and Rhythm: Normal rate and regular rhythm.     Heart sounds: Normal heart sounds.  Pulmonary:     Effort: Pulmonary effort is normal.     Breath sounds: Normal breath sounds.  Abdominal:     Palpations: Abdomen is soft.     Tenderness: There is no abdominal tenderness.  Musculoskeletal:     Lumbar back: He exhibits tenderness (diffuse, mild).  Skin:    General: Skin is warm and dry.  Neurological:     Mental Status: He is alert.     Comments: 5/5 strength in BLE. Normal gross sensation. Normal ambulation  Psychiatric:        Mood and Affect: Mood is not anxious or depressed.        Thought Content: Thought content does not include suicidal ideation. Thought content does not include suicidal plan.      ED Treatments / Results  Labs (all labs ordered are listed, but only abnormal  results are displayed) Labs Reviewed - No data to display  EKG None  Radiology No results found.  Procedures Procedures (including critical care time)  Medications Ordered in ED Medications - No data to display   Initial Impression / Assessment and Plan / ED Course  I have reviewed the triage vital signs and the  nursing notes.  Pertinent labs & imaging results that were available during my care of the patient were reviewed by me and considered in my medical decision making (see chart for details).     Patient repeatedly denies having any suicidal thoughts currently or even within the last month or so.  I do not think that he is at high risk to injure himself or others and he does not appear acutely psychotic or depressed.  His main problem today is needing help for sleep.  I think is reasonable to try a short course of Ambien and have him follow-up closely with his PCP.  As far as his back, this is a subacute problem and currently he is not showing any's signs of acute neurologic compromise to suggest cauda equina.  I do not think an emergent MRI is needed.  We discussed return precautions but he otherwise appears stable for outpatient follow-up with his PCP.  Final Clinical Impressions(s) / ED Diagnoses   Final diagnoses:  Acute bilateral low back pain with bilateral sciatica  Insomnia, unspecified type    ED Discharge Orders         Ordered    zolpidem (AMBIEN) 5 MG tablet  At bedtime PRN     02/12/18 1524           Pricilla Loveless, MD 02/12/18 1531

## 2018-02-12 NOTE — ED Notes (Signed)
ED Provider at bedside. 

## 2018-02-12 NOTE — Progress Notes (Signed)
Pre visit review using our clinic review tool, if applicable. No additional management support is needed unless otherwise documented below in the visit note. 

## 2018-02-22 ENCOUNTER — Telehealth: Payer: Self-pay | Admitting: *Deleted

## 2018-02-22 NOTE — Telephone Encounter (Signed)
Received Physician Orders from [facility]; forwarded to provider/SLS 02/03  

## 2018-02-24 ENCOUNTER — Ambulatory Visit: Payer: BLUE CROSS/BLUE SHIELD | Admitting: Family Medicine

## 2018-02-24 ENCOUNTER — Encounter: Payer: Self-pay | Admitting: Family Medicine

## 2018-02-24 ENCOUNTER — Ambulatory Visit (INDEPENDENT_AMBULATORY_CARE_PROVIDER_SITE_OTHER): Payer: BLUE CROSS/BLUE SHIELD | Admitting: Family Medicine

## 2018-02-24 VITALS — BP 108/68 | HR 95 | Temp 97.9°F | Ht 66.0 in | Wt 154.1 lb

## 2018-02-24 DIAGNOSIS — M549 Dorsalgia, unspecified: Secondary | ICD-10-CM | POA: Diagnosis not present

## 2018-02-24 DIAGNOSIS — G8929 Other chronic pain: Secondary | ICD-10-CM

## 2018-02-24 MED ORDER — CYANOCOBALAMIN 1000 MCG/ML IJ SOLN: 1000.0000 ug | INTRAMUSCULAR | 0 refills | Status: DC

## 2018-02-24 MED ORDER — "SYRINGE/NEEDLE (DISP) 23G X 1"" 3 ML MISC": 0 refills | Status: DC

## 2018-02-24 NOTE — Progress Notes (Signed)
Pre visit review using our clinic review tool, if applicable. No additional management support is needed unless otherwise documented below in the visit note. 

## 2018-02-24 NOTE — Patient Instructions (Addendum)
Someone should reach out to you regarding your MRI.  Be diligent with the home exercise program.  Please consider counseling. Contact [phone] to schedule an appointment or inquire about cost/insurance coverage.  Let us know if you need anything.

## 2018-02-24 NOTE — Progress Notes (Signed)
Chief Complaint  Patient presents with  . Back Pain    Subjective: Patient is a 38 y.o. male here for f/u back pain.  Patient is continuing back pain.  He has been seeing physical therapy without much relief.  He is also seeing the sports medicine team who recently increased his Elavil from 10 mg daily to 25 mg daily.  He is having weakness in his lower extremities.  It is making it difficult for him to work.  No loss of control of his bowel/bladder function.  He is trying to get in with the neurosurgeon but is having issues because his records are not being sent over from his previous provider in [place].   ROS: MSK: +LBP  Past Medical History:  Diagnosis Date  . Cervical stenosis of spinal canal   . GERD (gastroesophageal reflux disease)   . Major depression, recurrent (HCC)     Objective: BP 108/68 (BP Location: Left Arm, Patient Position: Sitting, Cuff Size: Normal)   Pulse 95   Temp 97.9 F (36.6 C) (Oral)   Ht 5\' 6"  (1.676 m)   Wt 154 lb 2 oz (69.9 kg)   SpO2 97%   BMI 24.88 kg/m  General: Awake, appears stated age MSK: +TTP over SI jt b/l, lumbar paraspinal musculature bilaterally, lower thoracic paraspinal musculature Neuro: DTRs 3/4 bilaterally, no clonus, no cerebellar signs; there is 4/5 strength on the right for hip flexion and 4/5 strength on the left or hip flexion, minimal effort given for knee flexion/extension Lungs: No accessory muscle use Psych: Age appropriate judgment and insight, normal affect and mood  Assessment and Plan: Chronic midline back pain, unspecified back location - Plan: MR Lumbar Spine Wo Contrast, MR Lumbar Spine Wo Contrast  We will check MRI given failure of conservative therapy and weakness.  Instructed patient to reach out to [facility] provider and see if he can get in with the neurosurgery team as soon as possible.  Number for counseling given as he is having situational depression/anxiety. I will plan to see him in 4 weeks to see if we  need to adjust his Elavil.  He is also free to continue seeing Dr. Pearletha Forge for this issue as well. The patient voiced understanding and agreement to the plan.  Jilda Roche Padroni, DO 02/24/18  12:08 PM

## 2018-03-10 ENCOUNTER — Other Ambulatory Visit: Payer: Self-pay

## 2018-03-18 ENCOUNTER — Encounter: Payer: Self-pay | Admitting: Family Medicine

## 2018-03-18 ENCOUNTER — Other Ambulatory Visit: Payer: Self-pay | Admitting: Family Medicine

## 2018-03-18 MED ORDER — AMITRIPTYLINE HCL 50 MG PO TABS: 50.0000 mg | ORAL_TABLET | Freq: Every day | ORAL | 2 refills | Status: DC

## 2018-03-24 ENCOUNTER — Telehealth: Payer: Self-pay | Admitting: *Deleted

## 2018-03-24 NOTE — Telephone Encounter (Signed)
Received Physician Orders from [facility]; forwarded to provider/SLS 03/04

## 2018-03-27 ENCOUNTER — Other Ambulatory Visit: Payer: Self-pay | Admitting: Family Medicine

## 2018-04-14 ENCOUNTER — Other Ambulatory Visit: Payer: Self-pay

## 2018-05-01 ENCOUNTER — Encounter: Payer: Self-pay | Admitting: Family Medicine

## 2018-05-03 ENCOUNTER — Other Ambulatory Visit: Payer: Self-pay | Admitting: Family Medicine

## 2018-05-03 MED ORDER — GABAPENTIN 600 MG PO TABS: 1200.0000 mg | ORAL_TABLET | Freq: Three times a day (TID) | ORAL | 2 refills | Status: DC

## 2018-05-03 MED ORDER — PREDNISONE 10 MG PO TABS: ORAL_TABLET | ORAL | 0 refills | Status: DC

## 2018-05-03 MED ORDER — TIZANIDINE HCL 6 MG PO CAPS: 6.0000 mg | ORAL_CAPSULE | Freq: Three times a day (TID) | ORAL | 0 refills | Status: DC | PRN

## 2018-05-03 NOTE — Telephone Encounter (Signed)
Please advise on below  

## 2018-05-17 ENCOUNTER — Other Ambulatory Visit: Payer: Self-pay

## 2018-06-15 ENCOUNTER — Other Ambulatory Visit: Payer: BLUE CROSS/BLUE SHIELD

## 2018-06-15 ENCOUNTER — Other Ambulatory Visit: Payer: Self-pay

## 2018-07-05 ENCOUNTER — Ambulatory Visit
Admission: RE | Admit: 2018-07-05 | Discharge: 2018-07-05 | Disposition: A | Discharge status: Home / Self Care | Payer: BC Managed Care – PPO | Source: Ambulatory Visit | Attending: Family Medicine | Admitting: Family Medicine

## 2018-07-05 DIAGNOSIS — G8929 Other chronic pain: Secondary | ICD-10-CM

## 2018-07-06 ENCOUNTER — Encounter: Payer: Self-pay | Admitting: Family Medicine

## 2018-11-24 IMAGING — DX DG CERVICAL SPINE COMPLETE 4+V
6 series · 6 of 6 positions shown · non-contrast
Comparison: None.

CLINICAL DATA: Left-sided neck pain, stiffness, and left arm
weakness.

EXAM:
CERVICAL SPINE - COMPLETE 4+ VIEW

[c-spine lat]
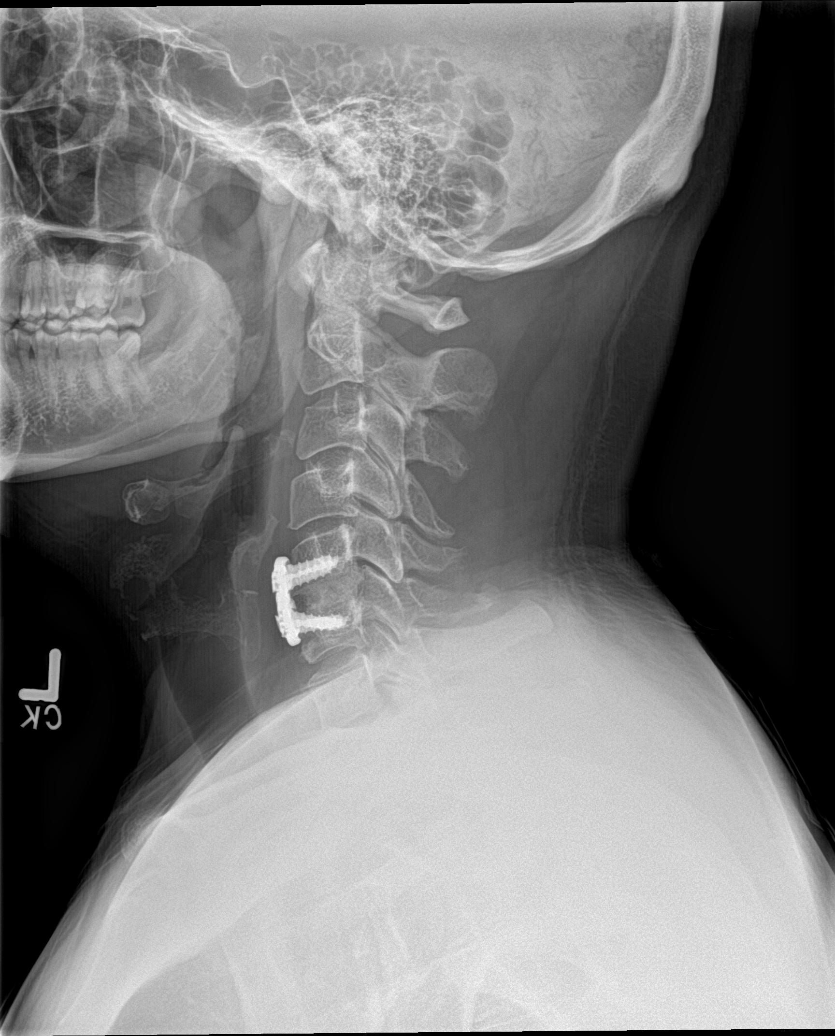

[c-spine obl (1 of 2)]
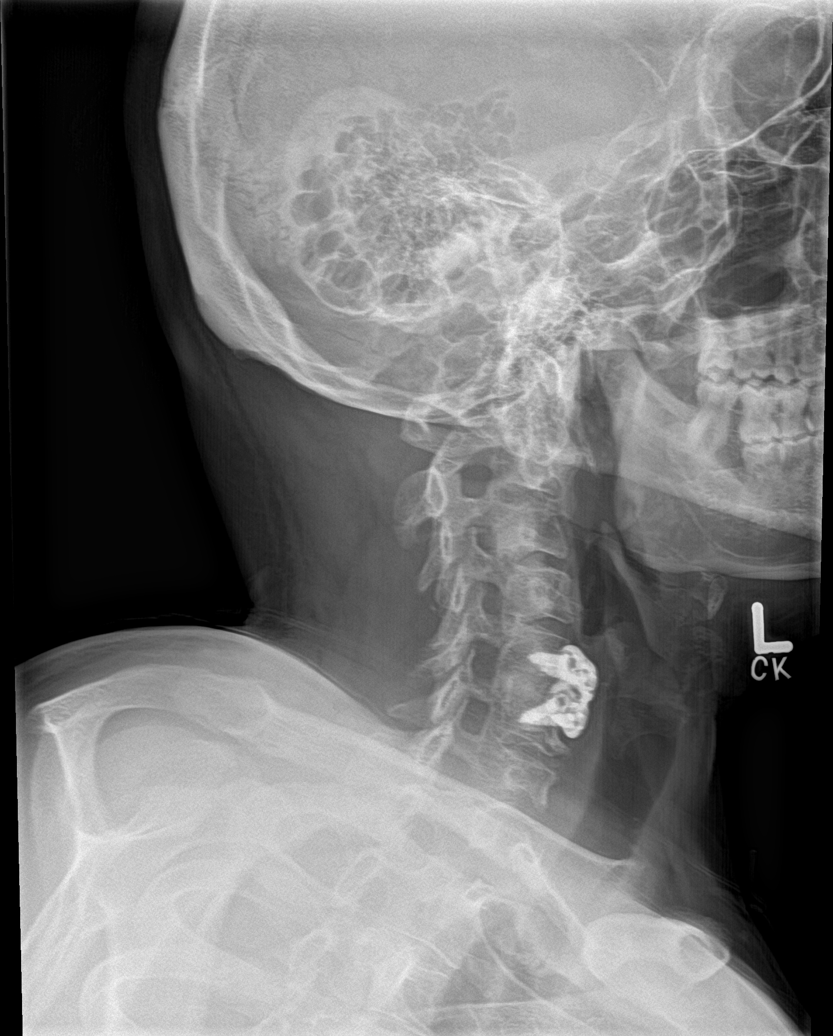

[c-spine obl (2 of 2)]
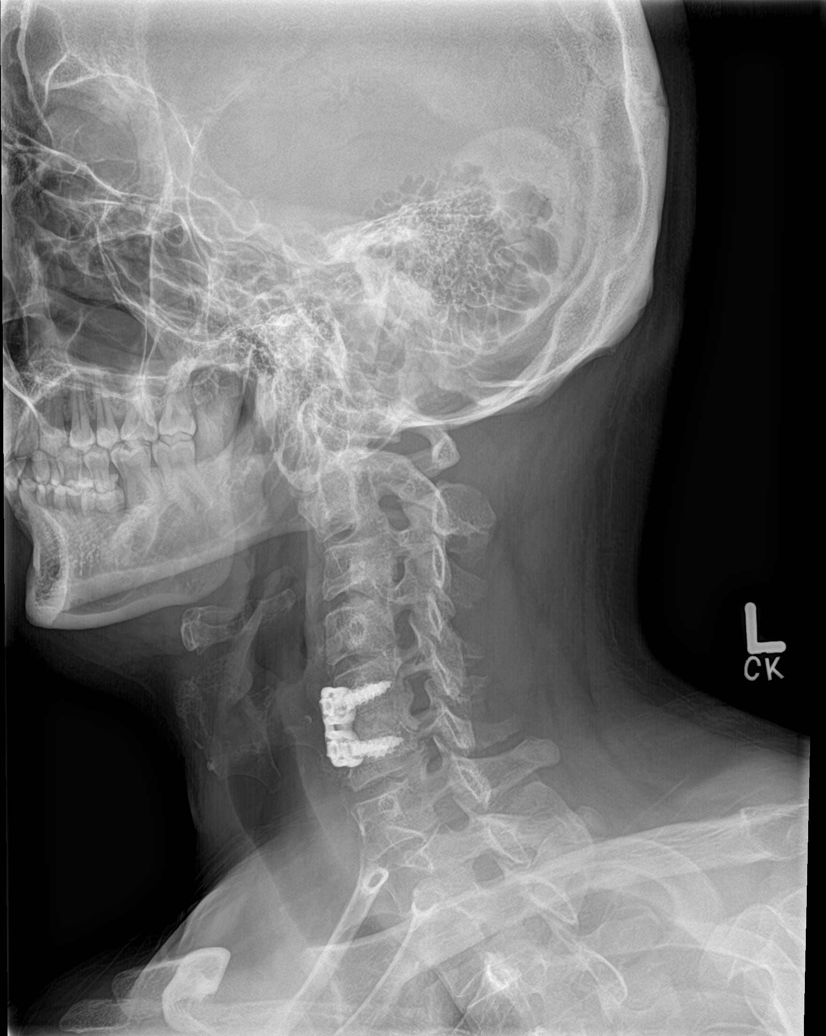

[c-spine ap]
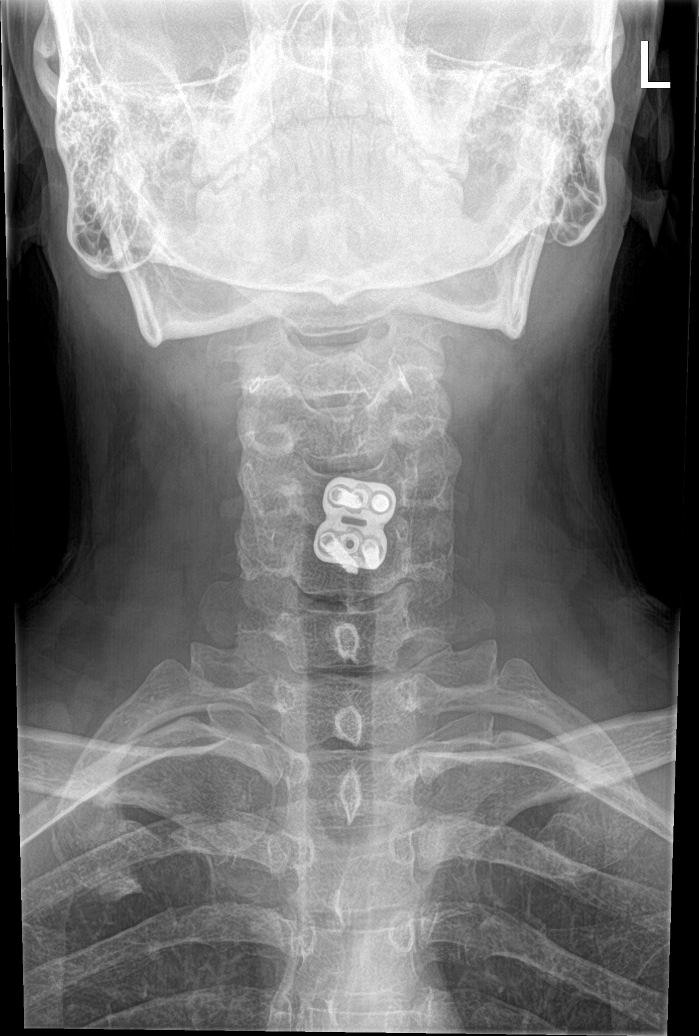

[c-spine open mouth]
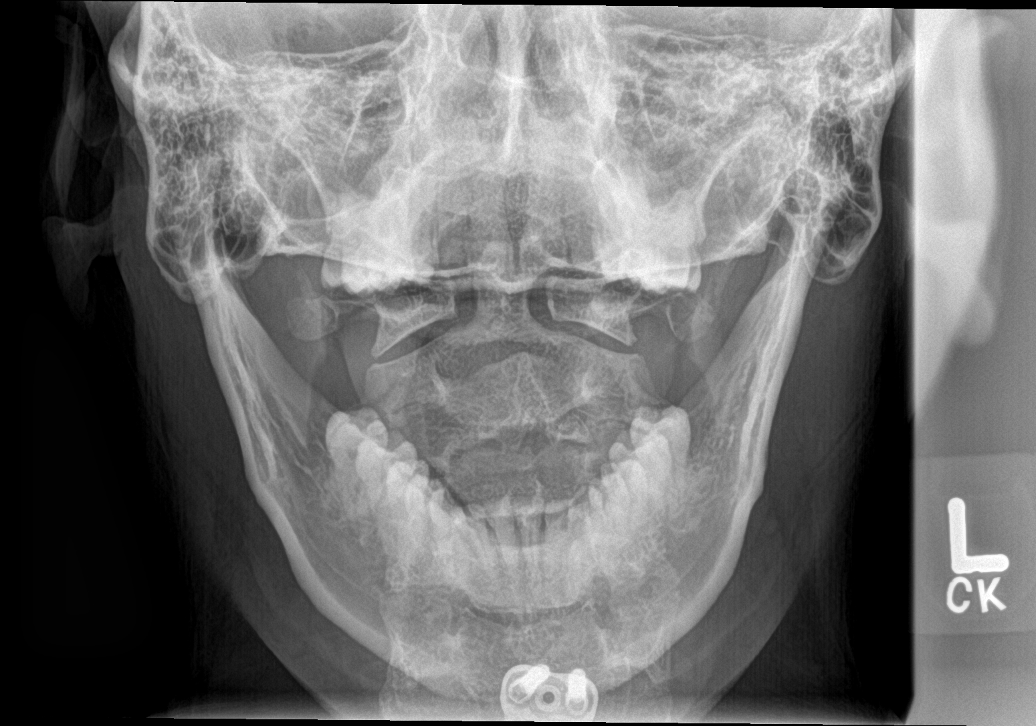

[c-spine swimmers]
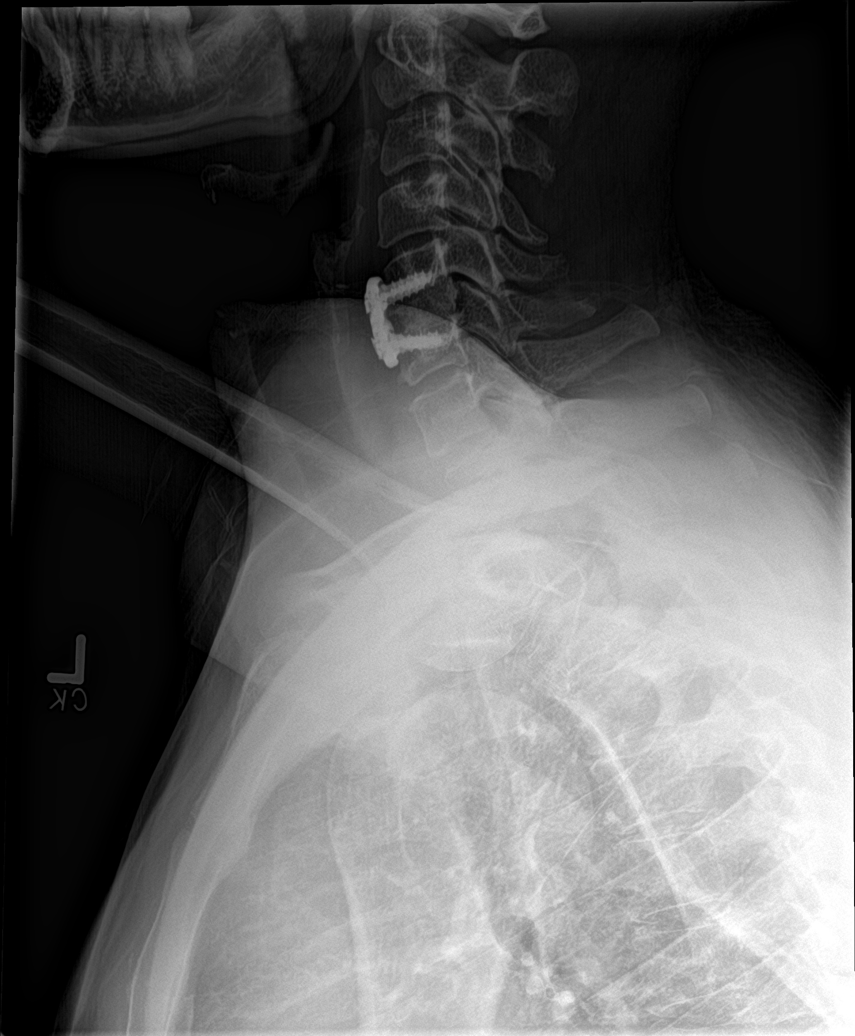

[6 of 6 positions shown; findings below may reference images not displayed]

FINDINGS: The lateral view is diagnostic to the C7 level. Prior C5-C6 ACDF
with solid osseous fusion across the disc space. No evidence of
hardware failure or loosening. There is no acute fracture or
subluxation. Vertebral body heights are preserved. 3 mm
retrolisthesis at C3-C4 and C4-C5. Mild disc height loss at C4-C5.
Moderate left and mild right neuroforaminal stenosis at C3-C4.Normal
prevertebral soft tissues.
IMPRESSION: 1. 3 mm retrolisthesis at C3-C4 resulting in moderate left and mild
right neuroforaminal stenosis.
2. Mild degenerative disc disease at C4-C5.
3. Prior C5-C6 ACDF without evidence of hardware complication.

## 2019-01-10 ENCOUNTER — Ambulatory Visit: Payer: BC Managed Care – PPO | Admitting: Family Medicine

## 2019-01-10 ENCOUNTER — Encounter: Payer: Self-pay | Admitting: Family Medicine

## 2019-01-10 ENCOUNTER — Ambulatory Visit (INDEPENDENT_AMBULATORY_CARE_PROVIDER_SITE_OTHER): Payer: BC Managed Care – PPO | Admitting: Family Medicine

## 2019-01-10 ENCOUNTER — Other Ambulatory Visit: Payer: Self-pay

## 2019-01-10 VITALS — BP 110/60 | HR 78 | Temp 97.7°F | Ht 66.0 in | Wt 153.2 lb

## 2019-01-10 DIAGNOSIS — M5010 Cervical disc disorder with radiculopathy, unspecified cervical region: Secondary | ICD-10-CM

## 2019-01-10 DIAGNOSIS — Z114 Encounter for screening for human immunodeficiency virus [HIV]: Secondary | ICD-10-CM

## 2019-01-10 DIAGNOSIS — M4802 Spinal stenosis, cervical region: Secondary | ICD-10-CM

## 2019-01-10 DIAGNOSIS — Z0000 Encounter for general adult medical examination without abnormal findings: Secondary | ICD-10-CM

## 2019-01-10 MED ORDER — "SYRINGE/NEEDLE (DISP) 23G X 1"" 3 ML MISC": 0 refills | Status: DC

## 2019-01-10 MED ORDER — CYANOCOBALAMIN 1000 MCG/ML IJ SOLN: 1000.0000 ug | INTRAMUSCULAR | 0 refills | Status: DC

## 2019-01-10 MED ORDER — VITAMIN D (ERGOCALCIFEROL) 1.25 MG (50000 UNIT) PO CAPS: 50000.0000 [IU] | ORAL_CAPSULE | ORAL | 0 refills | Status: DC
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
## 2019-01-10 NOTE — Patient Instructions (Addendum)
Sleep is important to [place] all. Getting good sleep is imperative to adequate functioning during the day. Work with our counselors who are trained to help people obtain quality sleep. Call [phone] to schedule an appointment or if you are curious about insurance coverage/cost.  If you do not hear anything about your referral in the next 1-2 weeks, call our office and ask for an update.  Keep the diet clean and stay active.  Give [place] 2-3 business days to get the results of your labs back.   Let us know if you need anything.

## 2019-01-11 ENCOUNTER — Other Ambulatory Visit: Payer: Self-pay | Admitting: Family Medicine

## 2019-01-11 ENCOUNTER — Encounter: Payer: Self-pay | Admitting: Family Medicine

## 2019-01-11 DIAGNOSIS — E559 Vitamin D deficiency, unspecified: Secondary | ICD-10-CM

## 2019-01-11 LAB — VITAMIN B12: Vitamin B-12: 229 pg/mL (ref 211–911)

## 2019-01-11 LAB — TSH: TSH: 1.53 u[IU]/mL (ref 0.35–4.50)

## 2019-01-11 LAB — CBC
HCT: 45 % (ref 39.0–52.0)
Hemoglobin: 14.9 g/dL (ref 13.0–17.0)
MCHC: 33.1 g/dL (ref 30.0–36.0)
MCV: 87.8 fl (ref 78.0–100.0)
Platelets: 284 10*3/uL (ref 150.0–400.0)
RBC: 5.13 Mil/uL (ref 4.22–5.81)
RDW: 12.8 % (ref 11.5–15.5)
WBC: 7.1 10*3/uL (ref 4.0–10.5)

## 2019-01-11 LAB — LIPID PANEL
Cholesterol: 175 mg/dL (ref 0–200)
HDL: 40.9 mg/dL (ref >39.00)
LDL Cholesterol: 97 mg/dL (ref 0–99)
NonHDL: 133.63
Total CHOL/HDL Ratio: 4
Triglycerides: 184 mg/dL — ABNORMAL HIGH (ref 0.0–149.0)
VLDL: 36.8 mg/dL (ref 0.0–40.0)

## 2019-01-11 LAB — COMPREHENSIVE METABOLIC PANEL
ALT: 20 U/L (ref 0–53)
AST: 21 U/L (ref 0–37)
Albumin: 4.1 g/dL (ref 3.5–5.2)
Alkaline Phosphatase: 50 U/L (ref 39–117)
BUN: 17 mg/dL (ref 6–23)
CO2: 33 mEq/L — ABNORMAL HIGH (ref 19–32)
Calcium: 9.8 mg/dL (ref 8.4–10.5)
Chloride: 101 mEq/L (ref 96–112)
Creatinine, Ser: 1.04 mg/dL (ref 0.40–1.50)
GFR: 78.86 mL/min (ref >60.00)
Glucose, Bld: 78 mg/dL (ref 70–99)
Potassium: 4.5 mEq/L (ref 3.5–5.1)
Sodium: 141 mEq/L (ref 135–145)
Total Bilirubin: 0.9 mg/dL (ref 0.2–1.2)
Total Protein: 6.8 g/dL (ref 6.0–8.3)

## 2019-01-11 LAB — HIV ANTIBODY (ROUTINE TESTING W REFLEX): HIV 1&2 Ab, 4th Generation: NONREACTIVE

## 2019-01-11 LAB — VITAMIN D 25 HYDROXY (VIT D DEFICIENCY, FRACTURES): VITD: 21.64 ng/mL — ABNORMAL LOW (ref 30.00–100.00)

## 2019-01-17 ENCOUNTER — Other Ambulatory Visit: Payer: Self-pay | Admitting: Family Medicine

## 2019-01-17 ENCOUNTER — Encounter: Payer: Self-pay | Admitting: Family Medicine

## 2019-01-17 DIAGNOSIS — E7849 Other hyperlipidemia: Secondary | ICD-10-CM

## 2019-01-17 NOTE — Progress Notes (Signed)
lipid

## 2019-02-04 ENCOUNTER — Encounter: Payer: Self-pay | Admitting: Family Medicine

## 2019-02-15 ENCOUNTER — Telehealth: Payer: Self-pay | Admitting: Family Medicine

## 2019-02-15 NOTE — Telephone Encounter (Signed)
Pt called upset with his provider  that he hasnt been able to get his Vaccine. 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 I explained that his PCP has  No control over the Vaccines. I did however put him on the wait list.

## 2019-03-17 ENCOUNTER — Other Ambulatory Visit: Payer: Self-pay | Admitting: Family Medicine

## 2019-04-11 ENCOUNTER — Other Ambulatory Visit: Payer: BC Managed Care – PPO

## 2019-12-03 ENCOUNTER — Other Ambulatory Visit: Payer: Self-pay | Admitting: Family Medicine

## 2019-12-05 NOTE — Telephone Encounter (Signed)
Patient did not come back to recheck.  Are you ok with refusing?  Last vitamin d checked 01/10/19 and was 21.64.  Was suppose to come back in 3 months to recheck.

## 2019-12-05 NOTE — Telephone Encounter (Signed)
Let's recheck before going back on it. Ty.

## 2019-12-06 ENCOUNTER — Other Ambulatory Visit: Payer: Self-pay | Admitting: Family Medicine

## 2019-12-06 MED ORDER — CYANOCOBALAMIN 1000 MCG/ML IJ SOLN: 1000.0000 ug | INTRAMUSCULAR | 0 refills | Status: DC

## 2019-12-06 NOTE — Telephone Encounter (Signed)
Left message on machine for patient to not pick up rx and to call office to set up lab only appt to recheck labs for lipid and vitamin d.

## 2020-02-10 ENCOUNTER — Ambulatory Visit: Payer: BC Managed Care – PPO | Admitting: Family Medicine

## 2020-02-10 ENCOUNTER — Ambulatory Visit (INDEPENDENT_AMBULATORY_CARE_PROVIDER_SITE_OTHER): Payer: BC Managed Care – PPO | Admitting: Family Medicine

## 2020-02-10 ENCOUNTER — Encounter: Payer: Self-pay | Admitting: Family Medicine

## 2020-02-10 ENCOUNTER — Other Ambulatory Visit: Payer: Self-pay

## 2020-02-10 VITALS — BP 108/64 | HR 91 | Temp 98.6°F | Ht 66.0 in | Wt 157.4 lb

## 2020-02-10 DIAGNOSIS — E538 Deficiency of other specified B group vitamins: Secondary | ICD-10-CM

## 2020-02-10 DIAGNOSIS — Z1159 Encounter for screening for other viral diseases: Secondary | ICD-10-CM

## 2020-02-10 DIAGNOSIS — R194 Change in bowel habit: Secondary | ICD-10-CM

## 2020-02-10 DIAGNOSIS — M6281 Muscle weakness (generalized): Secondary | ICD-10-CM

## 2020-02-10 DIAGNOSIS — M25512 Pain in left shoulder: Secondary | ICD-10-CM

## 2020-02-10 DIAGNOSIS — G8929 Other chronic pain: Secondary | ICD-10-CM

## 2020-02-10 DIAGNOSIS — M4802 Spinal stenosis, cervical region: Secondary | ICD-10-CM

## 2020-02-10 DIAGNOSIS — E559 Vitamin D deficiency, unspecified: Secondary | ICD-10-CM | POA: Diagnosis not present

## 2020-02-10 DIAGNOSIS — Z0001 Encounter for general adult medical examination with abnormal findings: Secondary | ICD-10-CM

## 2020-02-10 MED ORDER — CYANOCOBALAMIN 1000 MCG/ML IJ SOLN: 1000.0000 ug | INTRAMUSCULAR | 0 refills | Status: DC

## 2020-02-10 MED ORDER — TIZANIDINE HCL 6 MG PO CAPS: 6.0000 mg | ORAL_CAPSULE | Freq: Three times a day (TID) | ORAL | 2 refills | Status: DC | PRN
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
## 2020-02-10 NOTE — Patient Instructions (Signed)
Give us 2-3 business days to get the results of your labs back.   Keep the diet clean and stay active.  If you do not hear anything about your referrals in the next 1-2 weeks, call our office and ask for an update.  Let us know if you need anything.  

## 2020-02-16 ENCOUNTER — Other Ambulatory Visit: Payer: Self-pay

## 2020-02-16 ENCOUNTER — Other Ambulatory Visit (INDEPENDENT_AMBULATORY_CARE_PROVIDER_SITE_OTHER): Payer: BC Managed Care – PPO

## 2020-02-16 DIAGNOSIS — G8929 Other chronic pain: Secondary | ICD-10-CM

## 2020-02-16 DIAGNOSIS — M25512 Pain in left shoulder: Secondary | ICD-10-CM

## 2020-02-16 DIAGNOSIS — Z0001 Encounter for general adult medical examination with abnormal findings: Secondary | ICD-10-CM

## 2020-02-16 DIAGNOSIS — E538 Deficiency of other specified B group vitamins: Secondary | ICD-10-CM | POA: Diagnosis not present

## 2020-02-16 DIAGNOSIS — E559 Vitamin D deficiency, unspecified: Secondary | ICD-10-CM | POA: Diagnosis not present

## 2020-02-16 DIAGNOSIS — Z1159 Encounter for screening for other viral diseases: Secondary | ICD-10-CM

## 2020-02-16 LAB — COMPREHENSIVE METABOLIC PANEL
ALT: 20 U/L (ref 0–53)
AST: 20 U/L (ref 0–37)
Albumin: 3.9 g/dL (ref 3.5–5.2)
Alkaline Phosphatase: 44 U/L (ref 39–117)
BUN: 9 mg/dL (ref 6–23)
CO2: 31 mEq/L (ref 19–32)
Calcium: 9.5 mg/dL (ref 8.4–10.5)
Chloride: 105 mEq/L (ref 96–112)
Creatinine, Ser: 1.09 mg/dL (ref 0.40–1.50)
GFR: 84.19 mL/min (ref >60.00)
Glucose, Bld: 90 mg/dL (ref 70–99)
Potassium: 4.2 mEq/L (ref 3.5–5.1)
Sodium: 141 mEq/L (ref 135–145)
Total Bilirubin: 1.1 mg/dL (ref 0.2–1.2)
Total Protein: 6.7 g/dL (ref 6.0–8.3)

## 2020-02-16 LAB — LIPID PANEL
Cholesterol: 162 mg/dL (ref 0–200)
HDL: 38.5 mg/dL — ABNORMAL LOW (ref >39.00)
LDL Cholesterol: 102 mg/dL — ABNORMAL HIGH (ref 0–99)
NonHDL: 123.31
Total CHOL/HDL Ratio: 4
Triglycerides: 105 mg/dL (ref 0.0–149.0)
VLDL: 21 mg/dL (ref 0.0–40.0)

## 2020-02-16 LAB — SEDIMENTATION RATE: Sed Rate: 29 mm/hr — ABNORMAL HIGH (ref 0–15)

## 2020-02-16 LAB — CBC
HCT: 44.2 % (ref 39.0–52.0)
Hemoglobin: 14.9 g/dL (ref 13.0–17.0)
MCHC: 33.6 g/dL (ref 30.0–36.0)
MCV: 87 fl (ref 78.0–100.0)
Platelets: 264 10*3/uL (ref 150.0–400.0)
RBC: 5.08 Mil/uL (ref 4.22–5.81)
RDW: 13.1 % (ref 11.5–15.5)
WBC: 4.9 10*3/uL (ref 4.0–10.5)

## 2020-02-16 LAB — VITAMIN B12: Vitamin B-12: 228 pg/mL (ref 211–911)

## 2020-02-16 LAB — C-REACTIVE PROTEIN: CRP: <1 mg/dL (ref 0.5–20.0)

## 2020-02-16 LAB — VITAMIN D 25 HYDROXY (VIT D DEFICIENCY, FRACTURES): VITD: 33.28 ng/mL (ref 30.00–100.00)

## 2020-02-17 LAB — HEPATITIS C ANTIBODY
Hepatitis C Ab: NONREACTIVE
SIGNAL TO CUT-OFF: 0.01 (ref <1.00)

## 2020-12-28 ENCOUNTER — Telehealth: Payer: Self-pay | Admitting: Family Medicine

## 2020-12-28 ENCOUNTER — Encounter: Payer: Self-pay | Admitting: Family Medicine

## 2020-12-28 ENCOUNTER — Ambulatory Visit (INDEPENDENT_AMBULATORY_CARE_PROVIDER_SITE_OTHER): Payer: BC Managed Care – PPO | Admitting: Family Medicine

## 2020-12-28 ENCOUNTER — Other Ambulatory Visit: Payer: Self-pay | Admitting: Family Medicine

## 2020-12-28 VITALS — BP 130/88 | HR 82 | Temp 98.1°F | Ht 66.0 in | Wt 158.0 lb

## 2020-12-28 DIAGNOSIS — M5010 Cervical disc disorder with radiculopathy, unspecified cervical region: Secondary | ICD-10-CM | POA: Diagnosis not present

## 2020-12-28 MED ORDER — METHYLPREDNISOLONE ACETATE 80 MG/ML IJ SUSP
80.0000 mg | Freq: Once | INTRAMUSCULAR | Status: AC
  Administered 2020-12-28: 80 mg via INTRAMUSCULAR

## 2020-12-28 MED ORDER — MELOXICAM 15 MG PO TABS: 15.0000 mg | ORAL_TABLET | Freq: Every day | ORAL | 2 refills | Status: DC

## 2020-12-28 MED ORDER — TIZANIDINE HCL 6 MG PO CAPS: 6.0000 mg | ORAL_CAPSULE | Freq: Three times a day (TID) | ORAL | 2 refills | Status: DC | PRN

## 2020-12-28 NOTE — Telephone Encounter (Signed)
Received triage call directly from pt. He states that his pain is intense. We have no open appointments today, so I advised the pt to be seen at [facility] since they have imaging. He would like to speak to you before doing so since you know his surgical hx.

## 2020-12-28 NOTE — Telephone Encounter (Signed)
Pt. Stated he was in extreme pain to the point he would cry. Pain started in spine/back of neck, came down his shoulder and left arm. Pain level is at a 10. Transferred to triage for further advice.

## 2020-12-28 NOTE — Telephone Encounter (Signed)
Called pt and he was at [facility] but stated he would leave and come to the appt at 4:15

## 2020-12-28 NOTE — Patient Instructions (Signed)
Heat (pad or rice pillow in microwave) over affected area, 10-15 minutes twice daily.   Ice/cold pack over area for 10-15 min twice daily.  OK to take Tylenol 1000 mg (2 extra strength tabs) or 975 mg (3 regular strength tabs) every 6 hours as needed.  Let us know if you need anything.  Trapezius stretches/exercises Do exercises exactly as told by your health care provider and adjust them as directed. It is normal to feel mild stretching, pulling, tightness, or discomfort as you do these exercises, but you should stop right away if you feel sudden pain or your pain gets worse.   Stretching and range of motion exercises These exercises warm up your muscles and joints and improve the movement and flexibility of your shoulder. These exercises can also help to relieve pain, numbness, and tingling. If you are unable to do any of the following for any reason, do not further attempt to do it.   Exercise A: Flexion, standing     Stand and hold a broomstick, a cane, or a similar object. Place your hands a little more than shoulder-width apart on the object. Your left / right hand should be palm-up, and your other hand should be palm-down. Push the stick to raise your left / right arm out to your side and then over your head. Use your other hand to help move the stick. Stop when you feel a stretch in your shoulder, or when you reach the angle that is recommended by your health care provider. Avoid shrugging your shoulder while you raise your arm. Keep your shoulder blade tucked down toward your spine. Hold for 30 seconds. Slowly return to the starting position. Repeat 2 times. Complete this exercise 3 times per week.  Exercise B: Abduction, supine     Lie on your back and hold a broomstick, a cane, or a similar object. Place your hands a little more than shoulder-width apart on the object. Your left / right hand should be palm-up, and your other hand should be palm-down. Push the stick to raise  your left / right arm out to your side and then over your head. Use your other hand to help move the stick. Stop when you feel a stretch in your shoulder, or when you reach the angle that is recommended by your health care provider. Avoid shrugging your shoulder while you raise your arm. Keep your shoulder blade tucked down toward your spine. Hold for 30 seconds. Slowly return to the starting position. Repeat 2 times. Complete this exercise 3 times per week.  Exercise C: Flexion, active-assisted     Lie on your back. You may bend your knees for comfort. Hold a broomstick, a cane, or a similar object. Place your hands about shoulder-width apart on the object. Your palms should face toward your feet. Raise the stick and move your arms over your head and behind your head, toward the floor. Use your healthy arm to help your left / right arm move farther. Stop when you feel a gentle stretch in your shoulder, or when you reach the angle where your health care provider tells you to stop. Hold for 30 seconds. Slowly return to the starting position. Repeat 2 times. Complete this exercise 3 times per week.  Exercise D: External rotation and abduction     Stand in a door frame with one of your feet slightly in front of the other. This is called a staggered stance. Choose one of the following positions as told   by your health care provider: Place your hands and forearms on the door frame above your head. Place your hands and forearms on the door frame at the height of your head. Place your hands on the door frame at the height of your elbows. Slowly move your weight onto your front foot until you feel a stretch across your chest and in the front of your shoulders. Keep your head and chest upright and keep your abdominal muscles tight. Hold for 30 seconds. To release the stretch, shift your weight to your back foot. Repeat 2 times. Complete this stretch 3 times per week.  Strengthening  exercises These exercises build strength and endurance in your shoulder. Endurance is the ability to use your muscles for a long time, even after your muscles get tired. Exercise E: Scapular depression and adduction  Sit on a stable chair. Support your arms in front of you with pillows, armrests, or a tabletop. Keep your elbows in line with the sides of your body. Gently move your shoulder blades down toward your middle back. Relax the muscles on the tops of your shoulders and in the back of your neck. Hold for 3 seconds. Slowly release the tension and relax your muscles completely before doing this exercise again. Repeat for a total of 10 repetitions. After you have practiced this exercise, try doing the exercise without the arm support. Then, try the exercise while standing instead of sitting. Repeat 2 times. Complete this exercise 3 times per week.  Exercise F: Shoulder abduction, isometric     Stand or sit about 4-6 inches (10-15 cm) from a wall with your left / right side facing the wall. Bend your left / right elbow and gently press your elbow against the wall. Increase the pressure slowly until you are pressing as hard as you can without shrugging your shoulder. Hold for 3 seconds. Slowly release the tension and relax your muscles completely. Repeat for a total of 10 repetitions. Repeat 2 times. Complete this exercise 3 times per week.  Exercise G: Shoulder flexion, isometric     Stand or sit about 4-6 inches (10-15 cm) away from a wall with your left / right side facing the wall. Keep your left / right elbow straight and gently press the top of your fist against the wall. Increase the pressure slowly until you are pressing as hard as you can without shrugging your shoulder. Hold for 10-15 seconds. Slowly release the tension and relax your muscles completely. Repeat for a total of 10 repetitions. Repeat 2 times. Complete this exercise 3 times per week.  Exercise H: Internal  rotation     Sit in a stable chair without armrests, or stand. Secure an exercise band at your left / right side, at elbow height. Place a soft object, such as a folded towel or a small pillow, under your left / right upper arm so your elbow is a few inches (about 8 cm) away from your side. Hold the end of the exercise band so the band stretches. Keeping your elbow pressed against the soft object under your arm, move your forearm across your body toward your abdomen. Keep your body steady so the movement is only coming from your shoulder. Hold for 3 seconds. Slowly return to the starting position. Repeat for a total of 10 repetitions. Repeat 2 times. Complete this exercise 3 times per week.  Exercise I: External rotation     Sit in a stable chair without armrests, or stand. Secure an   exercise band at your left / right side, at elbow height. Place a soft object, such as a folded towel or a small pillow, under your left / right upper arm so your elbow is a few inches (about 8 cm) away from your side. Hold the end of the exercise band so the band stretches. Keeping your elbow pressed against the soft object under your arm, move your forearm out, away from your abdomen. Keep your body steady so the movement is only coming from your shoulder. Hold for 3 seconds. Slowly return to the starting position. Repeat for a total of 10 repetitions. Repeat 2 times. Complete this exercise 3 times per week. Exercise J: Shoulder extension  Sit in a stable chair without armrests, or stand. Secure an exercise band to a stable object in front of you so the band is at shoulder height. Hold one end of the exercise band in each hand. Your palms should face each other. Straighten your elbows and lift your hands up to shoulder height. Step back, away from the secured end of the exercise band, until the band stretches. Squeeze your shoulder blades together and pull your hands down to the sides of your thighs. Stop  when your hands are straight down by your sides. Do not let your hands go behind your body. Hold for 3 seconds. Slowly return to the starting position. Repeat for a total of 10 repetitions. Repeat 2 times. Complete this exercise 3 times per week.  Exercise K: Shoulder extension, prone     Lie on your abdomen on a firm surface so your left / right arm hangs over the edge. Hold a 5 lb weight in your hand so your palm faces in toward your body. Your arm should be straight. Squeeze your shoulder blade down toward the middle of your back. Slowly raise your arm behind you, up to the height of the surface that you are lying on. Keep your arm straight. Hold for 3 seconds. Slowly return to the starting position and relax your muscles. Repeat for a total of 10 repetitions. Repeat 2 times. Complete this exercise 3 times per week.   Exercise L: Horizontal abduction, prone  Lie on your abdomen on a firm surface so your left / right arm hangs over the edge. Hold a 5 lb weight in your hand so your palm faces toward your feet. Your arm should be straight. Squeeze your shoulder blade down toward the middle of your back. Bend your elbow so your hand moves up, until your elbow is bent to an "L" shape (90 degrees). With your elbow bent, slowly move your forearm forward and up. Raise your hand up to the height of the surface that you are lying on. Your upper arm should not move, and your elbow should stay bent. At the top of the movement, your palm should face the floor. Hold for 3 seconds. Slowly return to the starting position and relax your muscles. Repeat for a total of 10 repetitions. Repeat 2 times. Complete this exercise 3 times per week.  Exercise M: Horizontal abduction, standing  Sit on a stable chair, or stand. Secure an exercise band to a stable object in front of you so the band is at shoulder height. Hold one end of the exercise band in each hand. Straighten your elbows and lift your hands  straight in front of you, up to shoulder height. Your palms should face down, toward the floor. Step back, away from the secured end of the   exercise band, until the band stretches. Move your arms out to your sides, and keep your arms straight. Hold for 3 seconds. Slowly return to the starting position. Repeat for a total of 10 repetitions. Repeat 2 times. Complete this exercise 3 times per week.  Exercise N: Scapular retraction and elevation  Sit on a stable chair, or stand. Secure an exercise band to a stable object in front of you so the band is at shoulder height. Hold one end of the exercise band in each hand. Your palms should face each other. Sit in a stable chair without armrests, or stand. Step back, away from the secured end of the exercise band, until the band stretches. Squeeze your shoulder blades together and lift your hands over your head. Keep your elbows straight. Hold for 3 seconds. Slowly return to the starting position. Repeat for a total of 10 repetitions. Repeat 2 times. Complete this exercise 3 times per week.  This information is not intended to replace advice given to you by your health care provider. Make sure you discuss any questions you have with your health care provider. Document Released: 01/06/2005 Document Revised: 09/13/2015 Document Reviewed: 11/23/2014 Elsevier Interactive Patient Education  2017 [employer]  

## 2020-12-28 NOTE — Telephone Encounter (Signed)
Called left message to call back 

## 2020-12-28 NOTE — Telephone Encounter (Signed)
error 

## 2020-12-28 NOTE — Progress Notes (Signed)
Chief Complaint  Patient presents with   Pain    Subjective: Patient is a 40 y.o. male here for neck/arm pain.  Patient has a chronic history of cervical stenosis and chronic neck/upper shoulder/back pain.  This was well controlled with yoga, intermittent use of muscle relaxers, heat, ice.  Over the past 4 days, has had worsening pain.  He attributes his job to being a cause of this as he works in a [employer].  He is required to do more work and that exacerbates his symptoms in addition to his stress.  This also affects his sleep.  He is taking tizanidine with little relief.  He is requesting a shot of the medicine today.  Past Medical History:  Diagnosis Date   Cervical stenosis of spinal canal    GERD (gastroesophageal reflux disease)    Major depression, recurrent (HCC)     Objective: BP 130/88   Pulse 82   Temp 98.1 F (36.7 C) (Oral)   Ht 5\' 6"  (1.676 m)   Wt 158 lb (71.7 kg)   SpO2 99%   BMI 25.50 kg/m  General: Awake, appears stated age MSK: Tender to palpation over the trapezius bilaterally, worse on the left, he is very tender over the cervical and thoracic paraspinal musculature, worse in the left; no deformity Neuro: DTRs equal and symmetric in the upper extremities without clonus or cerebellar signs Lungs: No accessory muscle use Psych: Age appropriate judgment and insight, normal affect and mood  Assessment and Plan: Cervical disc disorder with radiculopathy of cervical region - Plan: tizanidine (ZANAFLEX) 6 MG capsule, meloxicam (MOBIC) 15 MG tablet  Exacerbation of chronic issue.  Refill Zanaflex, Mobic daily as needed.  Depo-Medrol 80 mg IM injection today.  Heat, ice, Tylenol, stretches and exercises provided.  Will consider trigger point injections if no improvement. The patient voiced understanding and agreement to the plan.  [place], DO 12/28/20  4:28 PM

## 2020-12-30 ENCOUNTER — Encounter (HOSPITAL_COMMUNITY): Payer: Self-pay

## 2020-12-30 ENCOUNTER — Ambulatory Visit (HOSPITAL_COMMUNITY)
Admission: EM | Admit: 2020-12-30 | Discharge: 2020-12-30 | Disposition: A | Discharge status: Home / Self Care | Payer: BC Managed Care – PPO | Attending: Urgent Care | Admitting: Urgent Care

## 2020-12-30 DIAGNOSIS — M5010 Cervical disc disorder with radiculopathy, unspecified cervical region: Secondary | ICD-10-CM

## 2020-12-30 MED ORDER — PREGABALIN 50 MG PO CAPS: 50.0000 mg | ORAL_CAPSULE | Freq: Three times a day (TID) | ORAL | 0 refills | Status: DC

## 2020-12-30 MED ORDER — DEXAMETHASONE 6 MG PO TABS: 6.0000 mg | ORAL_TABLET | Freq: Every day | ORAL | 0 refills | Status: AC

## 2020-12-30 MED ORDER — KETOROLAC TROMETHAMINE 60 MG/2ML IM SOLN
INTRAMUSCULAR | Status: AC
  Filled 2020-12-30: qty 2

## 2020-12-30 MED ORDER — KETOROLAC TROMETHAMINE 60 MG/2ML IM SOLN
60.0000 mg | Freq: Once | INTRAMUSCULAR | Status: AC
  Administered 2020-12-30: 60 mg via INTRAMUSCULAR

## 2020-12-30 MED ORDER — METAXALONE 800 MG PO TABS: 800.0000 mg | ORAL_TABLET | Freq: Three times a day (TID) | ORAL | 0 refills | Status: AC

## 2020-12-30 NOTE — Discharge Instructions (Addendum)
Start dexamethasone, lyrica, and skelaxin to aid in your pain. Call your PCP to get an MRI ordered for your cervical spine Continue moist heat/ compresses and start using your TENS unit to your neck/ shoulder May consider adding B6 to help with nerve symptoms. Out of work x 1 week to aid in improvement of sx and time for repeat imaging as deemed appropriate per PCP

## 2020-12-30 NOTE — ED Triage Notes (Signed)
Pt presents with chronic neck pain that radiates down into left shoulder that has been unrelieved with trigger pint injections and medrol pack.

## 2020-12-30 NOTE — ED Provider Notes (Signed)
[facility]    CSN: [id number] Arrival date & time: 12/30/20  1428      History   Chief Complaint Chief Complaint  Patient presents with   Neck Pain   Shoulder Pain    HPI James Sullivan is a 40 y.o. male.   41yo male who presents today with worsening neck and primarily L shoulder pain. Pt has a longstanding history of cervical spine issues. He was treated initially while living in [place]. He had surgery in 2016 prior to moving to [facility]. Unfortunately, the surgery did not resolve his issues and they continue to progress. Pt follows with his PCP for this. He was previously seeing neurosurgery, but due to limitations from his insurance carrier and cost, he is no longer following with the specialist. 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 He states that the nature of his job (holding phone with his head to his shoulder, poor posture of neck while looking at computer, twisting and bending to fill scripts) has made his symptoms worse not to mention his 2 hour car ride to work every day. He has tried and failed on numerous medications, including gabapentin which caused significant weight gain and severe depression, Elavil, carisoprodol, flexeril, methocarbamol, and several others. Pt reports his main reason for coming in today was to try Lyrica. He messaged his PCP on Friday afternoon, but their clinic was already closed. He states he is in severe pain and has not slept in two days. He is currently taking meloxicam 15mg  and tizanadine 6mg  without any relief. His last MRI spine was in 2019, he was hoping this could be updated today. He states his shoulder is very stiff and weak, and that he has to manually lift it with his R arm. Pt is very emotionally distraught as he states his father died at age 42 due to "some type of cervical spine issue while living in [place]." Pt is concerned he is developing his fathers condition.     Shoulder Pain Associated symptoms: back pain (chronic  L spine issues) and neck pain    Past Medical History:  Diagnosis Date   Cervical stenosis of spinal canal    GERD (gastroesophageal reflux disease)    Major depression, recurrent (HCC)     Patient Active Problem List   Diagnosis Date Noted   Chronic midline back pain 12/07/2017   Major depression, recurrent (HCC)    GERD (gastroesophageal reflux disease)    Cervical stenosis of spinal canal 08/20/2017   Moderate episode of recurrent major depressive disorder ([place]) 08/20/2017   Depression 08/03/2017   Cervical disc disorder with radiculopathy of cervical region 08/03/2017    Past Surgical History:  Procedure Laterality Date   ANTERIOR CERVICAL DECOMP/DISCECTOMY FUSION  12/2016       Home Medications    Prior to Admission medications   Medication Sig Start Date End Date Taking? Authorizing Provider  dexamethasone (DECADRON) 6 MG tablet Take 1 tablet (6 mg total) by mouth daily for 5 days. 12/30/20 01/04/21 Yes Demitrius Crass L, PA  metaxalone (SKELAXIN) 800 MG tablet Take 1 tablet (800 mg total) by mouth 3 (three) times daily for 5 days. 12/30/20 01/04/21 Yes Steward Sames L, PA  cyanocobalamin (,VITAMIN B-12,) 1000 MCG/ML injection Inject 1 mL (1,000 mcg total) into the muscle every 30 (thirty) days. 12/31/20   Shelda Pal, DO  meloxicam (MOBIC) 15 MG tablet Take 1 tablet (15 mg total) by mouth daily. 12/28/20   Shelda Pal, DO  pregabalin (LYRICA) 75 MG capsule Take 75 mg in the morning and 150 mg in the evening. 12/31/20   Sharlene Dory, DO  SYRINGE-NEEDLE, DISP, 3 ML 23G X 1" 3 ML MISC To use with Vitamin B12 injection 12/31/20   Wendling, Jilda Roche, DO  tizanidine (ZANAFLEX) 6 MG capsule Take 1 capsule (6 mg total) by mouth 3 (three) times daily as needed for muscle spasms. 12/28/20   Sharlene Dory, DO  Vitamin D, Ergocalciferol, (DRISDOL) 1.25 MG (50000 UNIT) CAPS capsule Take 1 capsule by mouth once a week 12/06/19    Sharlene Dory, DO    Family History Family History  Problem Relation Age of Onset   Narcolepsy Mother    Anxiety disorder Mother    Depression Mother    Early death Father     Social History Social History   Tobacco Use   Smoking status: Never   Smokeless tobacco: Never     Allergies   Patient has no known allergies.   Review of Systems Review of Systems  Constitutional:  Positive for unexpected weight change (secondary to medications).  Musculoskeletal:  Positive for arthralgias (L shoulder, neck), back pain (chronic L spine issues), myalgias, neck pain and neck stiffness. Negative for gait problem and joint swelling.  All other systems reviewed and are negative.   Physical Exam Triage Vital Signs ED Triage Vitals  Enc Vitals Group     BP 12/30/20 1546 119/76     Pulse Rate 12/30/20 1546 70     Resp 12/30/20 1546 18     Temp 12/30/20 1546 97.8 F (36.6 C)     Temp Source 12/30/20 1546 Oral     SpO2 12/30/20 1546 98 %     Weight --      Height --      Head Circumference --      Peak Flow --      Pain Score 12/30/20 1550 10     Pain Loc --      Pain Edu? --      Excl. in GC? --    No data found.  Updated Vital Signs BP 119/76 (BP Location: Right Arm)   Pulse 70   Temp 97.8 F (36.6 C) (Oral)   Resp 18   SpO2 98%   Visual Acuity Right Eye Distance:   Left Eye Distance:   Bilateral Distance:    Right Eye Near:   Left Eye Near:    Bilateral Near:     Physical Exam Vitals and nursing note reviewed.  Constitutional:      Appearance: He is normal weight.     Comments: NAD, but clearly uncomfortable. Pt wearing a soft C-spine collar  HENT:     Head: Normocephalic and atraumatic.     Mouth/Throat:     Mouth: Mucous membranes are moist.  Eyes:     Pupils: Pupils are equal, round, and reactive to light.  Neck:     Vascular: No carotid bruit.     Comments: Pt with significantly decreased ROM due to pain only.  No step off deformity  noted Cardiovascular:     Rate and Rhythm: Normal rate and regular rhythm.  Pulmonary:     Effort: Pulmonary effort is normal.  Musculoskeletal:        General: Tenderness (significant tenderness to L shoulder) present. No swelling or signs of injury.     Cervical back: No rigidity or tenderness.     Comments: Crepitus noted with  ROM of L shoulder 2/5 strength noted to L shoulder/ arm/ grip strength Pulses 2/2   Lymphadenopathy:     Cervical: No cervical adenopathy.  Neurological:     Mental Status: He is alert.     UC Treatments / Results  Labs (all labs ordered are listed, but only abnormal results are displayed) Labs Reviewed - No data to display  EKG   Radiology No results found.  Procedures Procedures (including critical care time)  Medications Ordered in UC Medications  ketorolac (TORADOL) injection 60 mg (60 mg Intramuscular Given 12/30/20 1644)    Initial Impression / Assessment and Plan / UC Course  I have reviewed the triage vital signs and the nursing notes.  Pertinent labs & imaging results that were available during my care of the patient were reviewed by me and considered in my medical decision making (see chart for details).     Cervical disc disorder with radiculopathy - this is a chronic disorder for patient since 2016. No red flag symptoms to warrant STAT CT today, however discussed potential need for this should sx progress rapidly. His job is the culprit of his current symptom flare. He has failed or not tolerated many medications. Will have pt stop tizanidine and start skelaxin in its place. Add dexamethasone and lyrica. Only 5 day supply provided of medications, he must f/u with PCP on Monday for further instructions. PDMP reviewed, no controlled substances noted. Called in by [facility] due to my Rx for controlled substances not yet set up L shoulder impingement - pt with worsening shoulder pain. Will change meds as above. ER for new/worsening  sx, needs workup best performed with specialist. Final Clinical Impressions(s) / UC Diagnoses   Final diagnoses:  Cervical disc disorder with radiculopathy of cervical region     Discharge Instructions      Start dexamethasone, lyrica, and skelaxin to aid in your pain. Call your PCP to get an MRI ordered for your cervical spine Continue moist heat/ compresses and start using your TENS unit to your neck/ shoulder May consider adding B6 to help with nerve symptoms.    ED Prescriptions     Medication Sig Dispense Auth. Provider   pregabalin (LYRICA) 50 MG capsule Take 1 capsule (50 mg total) by mouth 3 (three) times daily for 5 days. 15 capsule Lynden Oxford Scales, PA-C   metaxalone (SKELAXIN) 800 MG tablet Take 1 tablet (800 mg total) by mouth 3 (three) times daily for 5 days. 15 tablet Tafari Humiston L, PA   dexamethasone (DECADRON) 6 MG tablet Take 1 tablet (6 mg total) by mouth daily for 5 days. 5 tablet Pietro Bonura L, [place]      PDMP not reviewed this encounter.   Chaney Malling, [place] 01/02/21 [phone]

## 2020-12-31 ENCOUNTER — Other Ambulatory Visit: Payer: Self-pay | Admitting: Family Medicine

## 2020-12-31 ENCOUNTER — Telehealth: Payer: Self-pay

## 2020-12-31 MED ORDER — PREGABALIN 75 MG PO CAPS: ORAL_CAPSULE | ORAL | 2 refills | Status: DC

## 2020-12-31 MED ORDER — "SYRINGE/NEEDLE (DISP) 23G X 1"" 3 ML MISC": 0 refills | Status: DC

## 2020-12-31 MED ORDER — CYANOCOBALAMIN 1000 MCG/ML IJ SOLN: 1000.0000 ug | INTRAMUSCULAR | 0 refills | Status: DC

## 2020-12-31 NOTE — Telephone Encounter (Signed)
Pt evaluated/treated at [facility]   [facility] - Client Client Site [facility] - Night Provider Arva Chafe- MD Contact Type Call Who Is Calling Patient / Member / Family / Caregiver Call Type Triage / Clinical Relationship To Patient Self Return Phone Number [phone] (Primary) Chief Complaint Back Pain - General Reason for Call Symptomatic / Request for Health Information Initial Comment Caller states he has back pain and needs a rx. Translation No Nurse Assessment Nurse: Alexander Mt, RN, Nicholaus Bloom Date/Time (Eastern Time): 12/29/2020 7:29:35 AM Confirm and document reason for call. If symptomatic, describe symptoms. ---Caller states he has been having back pain and saw his doctor yesterday and got a steroid shot that has not helped. He had a fusion in 2018. Pain is on his left neck and he is unable to sleep pain is 10. Pain goes into the shoulder. Does the patient have any new or worsening symptoms? ---Yes Will a triage be completed? ---Yes Related visit to physician within the last 2 weeks? ---Yes Does the PT have any chronic conditions? (i.e. diabetes, asthma, this includes High risk factors for pregnancy, etc.) ---Yes List chronic conditions. ---hx of spinal fusion, Is this a behavioral health or substance abuse call? ---No Guidelines Guideline Title Affirmed Question Affirmed Notes Nurse Date/Time Lamount Cohen Time) Neck Pain or Stiffness Weakness of an arm or hand Alexander Mt, RN, Nicholaus Bloom 12/29/2020 7:34:09 AM Disp. Time Lamount Cohen Time) Disposition Final User 12/29/2020 7:19:45 AM Attempt made - message left Alexander Mt, RN, Nicholaus Bloom 12/29/2020 7:36:48 AM Go to ED Now Yes Alexander Mt, RN, Erasmo Score NOTE: All timestamps contained within this report are represented as [place] Standard Time. CONFIDENTIALTY NOTICE: This fax transmission is intended only for the addressee. It contains information that is  legally privileged, confidential or otherwise protected from use or disclosure. If you are not the intended recipient, you are strictly prohibited from reviewing, disclosing, copying using or disseminating any of this information or taking any action in reliance on or regarding this information. If you have received this fax in error, please notify us immediately by telephone so that we can arrange for its return to [place]. Phone: [phone], Toll-Free: [phone], Fax: [phone] Page: 2 of 2 Call Id: [id number] Caller Disagree/Comply Comply Caller Understands Yes PreDisposition Go to Urgent Care/Walk-In Clinic Care Advice Given Per Guideline GO TO ED NOW: * You need to be seen in the Emergency Department. * Go to the ED at ___________ Hospital. * Leave now. Drive carefully. * It is better and safer if another adult drives instead of you. ANOTHER ADULT SHOULD DRIVE: CARE ADVICE given per Neck Pain (Adult) guideline. Referrals [facility] [place] [facility]

## 2021-01-01 ENCOUNTER — Encounter: Payer: Self-pay | Admitting: Family Medicine

## 2021-01-01 ENCOUNTER — Other Ambulatory Visit: Payer: Self-pay | Admitting: Family Medicine

## 2021-01-01 ENCOUNTER — Telehealth: Payer: Self-pay | Admitting: Family Medicine

## 2021-01-01 DIAGNOSIS — M5010 Cervical disc disorder with radiculopathy, unspecified cervical region: Secondary | ICD-10-CM

## 2021-01-01 NOTE — Telephone Encounter (Signed)
Referrals done.

## 2021-01-01 NOTE — Telephone Encounter (Signed)
Nurse Assessment Nurse: Lorin Picket, RN, Elnita Maxwell Date/Time (Eastern Time): 01/01/2021 8:49:38 AM Confirm and document reason for call. If symptomatic, describe symptoms. ---Caller states her husband was seen on Friday and received an injection in his L shoulder for pain, is unable to use hand, is having severe pain now and is unable to work, has taken Gabapentin and Lyrica without relief, unable to drive, exhausted due to pain, denies other symptoms, Does the patient have any new or worsening symptoms? ---Yes Will a triage be completed? ---Yes Related visit to physician within the last 2 weeks? ---Yes Does the PT have any chronic conditions? (i.e. diabetes, asthma, this includes High risk factors for pregnancy, etc.) ---No Is this a behavioral health or substance abuse call? ---No Guidelines Guideline Title Affirmed Question Affirmed Notes Nurse Date/Time Lamount Cohen Time) Shoulder Pain [1] SEVERE pain AND [2] not improved 2 hours after pain medicine Lorin Picket, RN, Elnita Maxwell 01/01/2021 8:54:17 AM Disp. Time Lamount Cohen Time) Disposition Final User PLEASE NOTE: All timestamps contained within this report are represented as [place] Standard Time. CONFIDENTIALTY NOTICE: This fax transmission is intended only for the addressee. It contains information that is legally privileged, confidential or otherwise protected from use or disclosure. If you are not the intended recipient, you are strictly prohibited from reviewing, disclosing, copying using or disseminating any of this information or taking any action in reliance on or regarding this information. If you have received this fax in error, please notify us immediately by telephone so that we can arrange for its return to [place]. Phone: [phone], Toll-Free: [phone], Fax: [phone] Page: 2 of 2 Call Id: [id number] 01/01/2021 9:01:53 AM Go to ED Now (or PCP triage) Carlena Bjornstad, RN, Elizabeth Sauer Disagree/Comply Disagree Caller Understands  Yes PreDisposition Call Doctor Care Advice Given Per Guideline GO TO ED NOW (OR PCP TRIAGE): PAIN MEDICINES: * For pain relief, you can take either acetaminophen, ibuprofen, or naproxen. * They are over-the-counter (OTC) pain drugs. You can buy them at the drugstore. PAIN MEDICINES - EXTRA NOTES AND WARNINGS: * Follow these dosing instructions unless your doctor (or NP/PA) has told you to take a different dose. Comments User: Dorris Fetch, RN Date/Time Lamount Cohen Time): 01/01/2021 9:05:42 AM Refused ER and caller states she is just wanting a note for work because pt is unable to return to work next week, spoke with [place] in office no appts in next hour or the rest of the day, Instructed caller since they decline to go to ER to call office for note, transferred to office Referrals GO TO FACILITY REFUSED

## 2021-01-01 NOTE — Telephone Encounter (Signed)
The patient would like this to be healed by PT/Rehab but if PCP feels he needs to see a specialist he will do whatever PCP recommends. Ortho or spine?

## 2021-01-01 NOTE — Telephone Encounter (Signed)
Pt wife called and stated that her husband has been unable to work due to his arm and not being able to drive or use it. She wants to see if he can get a note for work extending his time out for another week until his arm/shoulder feels better.

## 2021-01-01 NOTE — Telephone Encounter (Signed)
Do you want that referral to go to a spine specialist only or ortho

## 2021-01-02 ENCOUNTER — Telehealth: Payer: Self-pay | Admitting: Family Medicine

## 2021-01-02 NOTE — Telephone Encounter (Signed)
Spoke to the patient and no he would not be a candidate for surgery. Does want to be seen there though. Also is requesting PCP to be ok to coordinate his PT care and OV with specialist. [facility]-- informed OV only needed at this time//no surgery /// they will be calling him today to get an appt scheduled.

## 2021-01-02 NOTE — Telephone Encounter (Signed)
Marissa from [facility] states pt's insurance is [employer] plan, so they can can see him in ov but can't see him if it becomes surgical. They would like to know if Dr. Carmelia Roller still wants him seen there or wants to place a new referral. Please advise. Number is [phone] ext. [phone].

## 2021-01-04 ENCOUNTER — Telehealth: Payer: Self-pay | Admitting: Family Medicine

## 2021-01-04 ENCOUNTER — Telehealth: Payer: BC Managed Care – PPO | Admitting: Family Medicine

## 2021-01-04 ENCOUNTER — Other Ambulatory Visit: Payer: Self-pay | Admitting: Student in an Organized Health Care Education/Training Program

## 2021-01-04 DIAGNOSIS — M5412 Radiculopathy, cervical region: Secondary | ICD-10-CM

## 2021-01-04 NOTE — Telephone Encounter (Signed)
Fmla paperwork was sent via fax for provider to fill out for patient. Put inside providers tray. Please advise.

## 2021-01-04 NOTE — Telephone Encounter (Signed)
Pt's wife wants to bring office notes from spine dr and go over this with Zella Ball, did try to ask pt if spine dr had already faxed over notes but she did not know. Please advise.

## 2021-01-04 NOTE — Telephone Encounter (Signed)
Have spoken to his wife and given information to Dr. Carmelia Roller.

## 2021-01-07 ENCOUNTER — Encounter: Payer: Self-pay | Admitting: Family Medicine

## 2021-01-07 ENCOUNTER — Telehealth (INDEPENDENT_AMBULATORY_CARE_PROVIDER_SITE_OTHER): Payer: BC Managed Care – PPO | Admitting: Family Medicine

## 2021-01-07 DIAGNOSIS — M5412 Radiculopathy, cervical region: Secondary | ICD-10-CM

## 2021-01-07 NOTE — Progress Notes (Signed)
Chief Complaint  Patient presents with   Complete FMLA paperwork    Subjective: Patient is a 40 y.o. male here for f/u neck pain. Due to COVID-19 pandemic, we are interacting via web portal for an electronic face-to-face visit. I verified patient's ID using 2 identifiers. Patient agreed to proceed with visit via this method. Patient is at home, I am at office. Patient, his wife and I are present for visit.   Patient had a relapse of his chronic neck pain/upper extremity weakness on 12/9.  He has been unable to work since then.  He was referred to a physiatrist who ordered physical therapy and an MRI to be done.  He starts physical therapy this week and has an MRI in 01/25/2021.  He still cannot lift his arms or turn his head reasonably.  He still having pain.  He is currently taking Lyrica 75 mg in the morning and 150 mg in the evening, meloxicam 15 mg daily, and tizanidine 4 mg 4 times daily as needed.  No side effects with those medications and they are somewhat helping.  He is also needing a form completed for work.  His hope is to return in 6 weeks on a reduced schedule and gradually increase his workload from there.  Past Medical History:  Diagnosis Date   Cervical stenosis of spinal canal    GERD (gastroesophageal reflux disease)    Major depression, recurrent (HCC)     Objective: No conversational dyspnea Age appropriate judgment and insight Nml affect and mood  Assessment and Plan: Brachial neuritis  I filled out his form for disability to be picked up tomorrow.  If he cannot lift his arms, I agree with him and his wife that he should not be driving.  Appreciate input from physiatry.  They may need to take arrangements for future disability paperwork as they will know the prognosis better than me. The patient voiced understanding and agreement to the plan.  I spent around 30 minutes with the patient and his wife discussing the above plan, filling out paperwork, and reviewing his  chart on the same day of the visit.  Jilda Roche Howell, DO 01/07/21  4:35 PM

## 2021-01-10 ENCOUNTER — Telehealth: Payer: Self-pay | Admitting: Family Medicine

## 2021-01-10 NOTE — Telephone Encounter (Signed)
Pt called and wanted to see if FMLA paperwork was faxed over to job. He knows a copy was given to his wife. He just wanted to see if Zella Ball faxed it or if his wife needed to submit it.

## 2021-01-16 NOTE — Telephone Encounter (Signed)
Spoke to the patient and he will have his wife return paperwork///can then fax for him He states he was to sign/PCP needs to sign as well. Patient wife has appt with PCP 01/17/21. She will bring with her and we can then fax

## 2021-01-17 NOTE — Telephone Encounter (Signed)
Pt's spouse dropped off FMLA paperwork to be filled out. Document in a large yellow envelope. Document put at front office tray under providers name.

## 2021-01-18 NOTE — Telephone Encounter (Signed)
pt called and would like documents faxed.

## 2021-01-18 NOTE — Telephone Encounter (Signed)
Documents faxed. Copied for scan.

## 2021-01-25 ENCOUNTER — Other Ambulatory Visit: Payer: BC Managed Care – PPO

## 2021-01-26 ENCOUNTER — Ambulatory Visit
Admission: RE | Admit: 2021-01-26 | Discharge: 2021-01-26 | Disposition: A | Discharge status: Home / Self Care | Payer: BC Managed Care – PPO | Source: Ambulatory Visit | Attending: Student in an Organized Health Care Education/Training Program | Admitting: Student in an Organized Health Care Education/Training Program

## 2021-01-26 ENCOUNTER — Other Ambulatory Visit: Payer: Self-pay

## 2021-01-26 DIAGNOSIS — M5412 Radiculopathy, cervical region: Secondary | ICD-10-CM

## 2021-02-11 ENCOUNTER — Ambulatory Visit (INDEPENDENT_AMBULATORY_CARE_PROVIDER_SITE_OTHER): Payer: BC Managed Care – PPO | Admitting: Family Medicine

## 2021-02-11 ENCOUNTER — Encounter: Payer: Self-pay | Admitting: Family Medicine

## 2021-02-11 VITALS — BP 108/68 | HR 71 | Temp 98.3°F | Ht 66.0 in | Wt 156.0 lb

## 2021-02-11 DIAGNOSIS — Z0000 Encounter for general adult medical examination without abnormal findings: Secondary | ICD-10-CM | POA: Diagnosis not present

## 2021-02-11 DIAGNOSIS — M4802 Spinal stenosis, cervical region: Secondary | ICD-10-CM

## 2021-02-11 LAB — COMPREHENSIVE METABOLIC PANEL
ALT: 25 U/L (ref 0–53)
AST: 21 U/L (ref 0–37)
Albumin: 4.3 g/dL (ref 3.5–5.2)
Alkaline Phosphatase: 56 U/L (ref 39–117)
BUN: 7 mg/dL (ref 6–23)
CO2: 30 mEq/L (ref 19–32)
Calcium: 9.9 mg/dL (ref 8.4–10.5)
Chloride: 101 mEq/L (ref 96–112)
Creatinine, Ser: 1.03 mg/dL (ref 0.40–1.50)
GFR: 89.48 mL/min (ref >60.00)
Glucose, Bld: 82 mg/dL (ref 70–99)
Potassium: 4.5 mEq/L (ref 3.5–5.1)
Sodium: 140 mEq/L (ref 135–145)
Total Bilirubin: 0.8 mg/dL (ref 0.2–1.2)
Total Protein: 7.4 g/dL (ref 6.0–8.3)

## 2021-02-11 LAB — CBC
HCT: 47 % (ref 39.0–52.0)
Hemoglobin: 15.4 g/dL (ref 13.0–17.0)
MCHC: 32.8 g/dL (ref 30.0–36.0)
MCV: 87.8 fl (ref 78.0–100.0)
Platelets: 278 10*3/uL (ref 150.0–400.0)
RBC: 5.35 Mil/uL (ref 4.22–5.81)
RDW: 13.4 % (ref 11.5–15.5)
WBC: 6.9 10*3/uL (ref 4.0–10.5)

## 2021-02-11 LAB — VITAMIN B12: Vitamin B-12: 174 pg/mL — ABNORMAL LOW (ref 211–911)

## 2021-02-11 LAB — HEMOGLOBIN A1C: Hgb A1c MFr Bld: 5.7 % (ref 4.6–6.5)

## 2021-02-11 LAB — VITAMIN D 25 HYDROXY (VIT D DEFICIENCY, FRACTURES): VITD: 26.08 ng/mL — ABNORMAL LOW (ref 30.00–100.00)

## 2021-02-11 LAB — LIPID PANEL
Cholesterol: 201 mg/dL — ABNORMAL HIGH (ref 0–200)
HDL: 43.6 mg/dL (ref >39.00)
LDL Cholesterol: 130 mg/dL — ABNORMAL HIGH (ref 0–99)
NonHDL: 157.4
Total CHOL/HDL Ratio: 5
Triglycerides: 139 mg/dL (ref 0.0–149.0)
VLDL: 27.8 mg/dL (ref 0.0–40.0)

## 2021-02-11 MED ORDER — VITAMIN D (ERGOCALCIFEROL) 1.25 MG (50000 UNIT) PO CAPS: 50000.0000 [IU] | ORAL_CAPSULE | ORAL | 0 refills | Status: DC

## 2021-02-11 MED ORDER — CYANOCOBALAMIN 1000 MCG/ML IJ SOLN: 1000.0000 ug | INTRAMUSCULAR | 0 refills | Status: DC

## 2021-02-11 NOTE — Patient Instructions (Addendum)
Give [place] 2-3 business days to get the results of your labs back.   Keep the diet clean and stay active.  I will keep my eye out for your paperwork to extend another month.   Let us know if you need anything.
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
## 2021-02-18 ENCOUNTER — Encounter: Payer: Self-pay | Admitting: Family Medicine

## 2021-02-20 ENCOUNTER — Encounter: Payer: Self-pay | Admitting: Family Medicine

## 2021-02-25 ENCOUNTER — Ambulatory Visit (INDEPENDENT_AMBULATORY_CARE_PROVIDER_SITE_OTHER): Payer: BC Managed Care – PPO | Admitting: Family Medicine

## 2021-02-25 ENCOUNTER — Encounter: Payer: Self-pay | Admitting: Family Medicine

## 2021-02-25 VITALS — BP 110/68 | HR 78 | Temp 98.0°F | Ht 66.0 in | Wt 156.0 lb

## 2021-02-25 DIAGNOSIS — M4802 Spinal stenosis, cervical region: Secondary | ICD-10-CM

## 2021-02-25 NOTE — Progress Notes (Addendum)
Chief Complaint  Patient presents with   discuss surgery options    Subjective: Patient is a 41 y.o. male here for f/u. Here w wife.   Dealing w L sided cerv stenosis.  He had surgery while living in [place].  They operated on C5/6.  There is progression of stenosis at that level in addition to severe stenosis at C6/7.  His current neurologist told him he needs to have surgery relatively soon.  To the patient's understanding, he needs some extra time to help get it approved so he does not owe over $100,000 and fees.  Past Medical History:  Diagnosis Date   Cervical stenosis of spinal canal    GERD (gastroesophageal reflux disease)    Major depression, recurrent (HCC)     Objective: BP 110/68    Pulse 78    Temp 98 F (36.7 C) (Oral)    Ht 5\' 6"  (1.676 m)    Wt 156 lb (70.8 kg)    SpO2 99%    BMI 25.18 kg/m  General: Awake, appears stated age MSK: Rigid posture Lungs: No accessory muscle use Psych: Age appropriate judgment and insight, normal affect and mood  Assessment and Plan: Neuroforaminal stenosis of cervical spine - Plan: Ambulatory referral to Neurosurgery  We will get second opinion.  We will follow-up.  We will for him.  Continue medication we have been using.  Try to stay as active as possible.  Avoid aggravating activities.  Follow-up as originally scheduled. The patient and his spouse voiced understanding and agreement to the plan.  I spent 40 minutes with the patient discussing the above plan in addition to reviewing his chart in the same day of the visit and filling out paperwork.  [place], DO 02/25/21  12:23 PM

## 2021-02-25 NOTE — Patient Instructions (Addendum)
If you do not hear anything about your referral in the next few days, call our office and ask for an update.  Let me know if you need refills or paperwork done.   Let us know if you need anything.

## 2021-02-27 ENCOUNTER — Encounter: Payer: Self-pay | Admitting: Family Medicine

## 2021-03-12 ENCOUNTER — Encounter: Payer: Self-pay | Admitting: Family Medicine

## 2021-03-26 ENCOUNTER — Telehealth: Payer: Self-pay | Admitting: Family Medicine

## 2021-03-26 NOTE — Telephone Encounter (Signed)
Forms faxed into front office ?Placed in [place] folder up front ? ?

## 2021-03-26 NOTE — Telephone Encounter (Signed)
Per PCP request scheduled my chart video visit for Friday 03/29/21 at 7:30 AM. ?

## 2021-03-28 ENCOUNTER — Other Ambulatory Visit: Payer: Self-pay | Admitting: Family Medicine

## 2021-03-29 ENCOUNTER — Encounter: Payer: Self-pay | Admitting: Family Medicine

## 2021-03-29 ENCOUNTER — Telehealth (INDEPENDENT_AMBULATORY_CARE_PROVIDER_SITE_OTHER): Payer: BC Managed Care – PPO | Admitting: Family Medicine

## 2021-03-29 DIAGNOSIS — M5010 Cervical disc disorder with radiculopathy, unspecified cervical region: Secondary | ICD-10-CM | POA: Diagnosis not present

## 2021-03-29 MED ORDER — PREGABALIN 75 MG PO CAPS: ORAL_CAPSULE | ORAL | 2 refills | Status: DC

## 2021-03-29 MED ORDER — CYANOCOBALAMIN 1000 MCG/ML IJ SOLN: 1000.0000 ug | INTRAMUSCULAR | 0 refills | Status: DC

## 2021-03-29 MED ORDER — TIZANIDINE HCL 6 MG PO CAPS: 6.0000 mg | ORAL_CAPSULE | Freq: Three times a day (TID) | ORAL | 2 refills | Status: DC | PRN

## 2021-03-29 NOTE — Telephone Encounter (Signed)
Paperwork completed. ?Faxed to both numbers --[phone] and [phone] ?[place] ?Claim #[id number] ?[employer] [phone] ?Copy for scan/and patient. ? ?
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
## 2021-03-29 NOTE — Telephone Encounter (Signed)
Called the patient to inform FMLA paperwork completed/and faxed.  ?Left message to call back ?

## 2021-04-09 ENCOUNTER — Telehealth: Payer: Self-pay

## 2021-04-16 NOTE — Telephone Encounter (Signed)
Error

## 2021-08-06 ENCOUNTER — Other Ambulatory Visit: Payer: Self-pay | Admitting: Family Medicine

## 2022-02-14 ENCOUNTER — Ambulatory Visit (INDEPENDENT_AMBULATORY_CARE_PROVIDER_SITE_OTHER): Payer: BC Managed Care – PPO | Admitting: Family Medicine

## 2022-02-14 ENCOUNTER — Encounter: Payer: Self-pay | Admitting: Family Medicine

## 2022-02-14 VITALS — BP 118/72 | HR 77 | Temp 98.1°F | Ht 66.0 in | Wt 153.2 lb

## 2022-02-14 DIAGNOSIS — E538 Deficiency of other specified B group vitamins: Secondary | ICD-10-CM

## 2022-02-14 DIAGNOSIS — F411 Generalized anxiety disorder: Secondary | ICD-10-CM

## 2022-02-14 DIAGNOSIS — R7303 Prediabetes: Secondary | ICD-10-CM | POA: Diagnosis not present

## 2022-02-14 DIAGNOSIS — Z0000 Encounter for general adult medical examination without abnormal findings: Secondary | ICD-10-CM | POA: Diagnosis not present

## 2022-02-14 DIAGNOSIS — E559 Vitamin D deficiency, unspecified: Secondary | ICD-10-CM

## 2022-02-14 LAB — COMPREHENSIVE METABOLIC PANEL
ALT: 23 U/L (ref 0–53)
AST: 22 U/L (ref 0–37)
Albumin: 4 g/dL (ref 3.5–5.2)
Alkaline Phosphatase: 52 U/L (ref 39–117)
BUN: 13 mg/dL (ref 6–23)
CO2: 30 mEq/L (ref 19–32)
Calcium: 9.4 mg/dL (ref 8.4–10.5)
Chloride: 104 mEq/L (ref 96–112)
Creatinine, Ser: 1.01 mg/dL (ref 0.40–1.50)
GFR: 90.97 mL/min (ref >60.00)
Glucose, Bld: 88 mg/dL (ref 70–99)
Potassium: 4 mEq/L (ref 3.5–5.1)
Sodium: 142 mEq/L (ref 135–145)
Total Bilirubin: 1.2 mg/dL (ref 0.2–1.2)
Total Protein: 6.8 g/dL (ref 6.0–8.3)

## 2022-02-14 LAB — CBC
HCT: 42.5 % (ref 39.0–52.0)
Hemoglobin: 14.4 g/dL (ref 13.0–17.0)
MCHC: 33.9 g/dL (ref 30.0–36.0)
MCV: 86.8 fl (ref 78.0–100.0)
Platelets: 263 10*3/uL (ref 150.0–400.0)
RBC: 4.89 Mil/uL (ref 4.22–5.81)
RDW: 13.7 % (ref 11.5–15.5)
WBC: 6.4 10*3/uL (ref 4.0–10.5)

## 2022-02-14 LAB — LIPID PANEL
Cholesterol: 167 mg/dL (ref 0–200)
HDL: 39.3 mg/dL (ref >39.00)
LDL Cholesterol: 108 mg/dL — ABNORMAL HIGH (ref 0–99)
NonHDL: 127.56
Total CHOL/HDL Ratio: 4
Triglycerides: 100 mg/dL (ref 0.0–149.0)
VLDL: 20 mg/dL (ref 0.0–40.0)

## 2022-02-14 LAB — VITAMIN B12: Vitamin B-12: 169 pg/mL — ABNORMAL LOW (ref 211–911)

## 2022-02-14 LAB — HEMOGLOBIN A1C: Hgb A1c MFr Bld: 5.8 % (ref 4.6–6.5)

## 2022-02-14 LAB — VITAMIN D 25 HYDROXY (VIT D DEFICIENCY, FRACTURES): VITD: 18.3 ng/mL — ABNORMAL LOW (ref 30.00–100.00)

## 2022-02-14 MED ORDER — VENLAFAXINE HCL ER 37.5 MG PO CP24: 37.5000 mg | ORAL_CAPSULE | Freq: Every day | ORAL | 2 refills | Status: DC

## 2022-02-14 NOTE — Progress Notes (Signed)
Chief Complaint  Patient presents with   Annual Exam    anxiety    Well Male James Sullivan is here for a complete physical.   His last physical was >1 year ago.  Current diet: in general, a "healthy" diet.   Current exercise: yoga Weight trend: stable Fatigue out of ordinary? No. Seat belt? Yes.   Advanced directive? No  Health maintenance Tetanus- Yes HIV- Yes Hep C- Yes  Anxiety Hx of anxiety/depression. Has been having poor sleep, loss of concentration, depressed thoughts. No SI or HI. No self medication. Not following with a counselor.   Past Medical History:  Diagnosis Date   Cervical stenosis of spinal canal    GERD (gastroesophageal reflux disease)    Major depression, recurrent (HCC)      Past Surgical History:  Procedure Laterality Date   ANTERIOR CERVICAL DECOMP/DISCECTOMY FUSION  12/2016    Medications  Current Outpatient Medications on File Prior to Visit  Medication Sig Dispense Refill   cyanocobalamin (,VITAMIN B-12,) 1000 MCG/ML injection Inject 1 mL (1,000 mcg total) into the muscle every 30 (thirty) days. 10 mL 0   hydrOXYzine (ATARAX) 50 MG tablet Take 50 mg by mouth at bedtime.     omeprazole (PRILOSEC) 40 MG capsule Take 40 mg by mouth daily.     pregabalin (LYRICA) 75 MG capsule Take 75 mg in the morning and 150 mg in the evening. (Patient taking differently: Take 150 mg in the evening.) 90 capsule 2   SOMA 250 MG tablet Take 250 mg by mouth at bedtime.     SYRINGE-NEEDLE, DISP, 3 ML 23G X 1" 3 ML MISC To use with Vitamin B12 injection 50 each 0   Vitamin D, Ergocalciferol, (DRISDOL) 1.25 MG (50000 UNIT) CAPS capsule Take 1 capsule by mouth once a week 12 capsule 0    Allergies No Known Allergies  Family History Family History  Problem Relation Age of Onset   Narcolepsy Mother    Anxiety disorder Mother    Depression Mother    Early death Father     Review of Systems: Constitutional: no fevers or chills Eye:  no recent  significant change in vision Ear/Nose/Mouth/Throat:  Ears:  no hearing loss Nose/Mouth/Throat:  no complaints of nasal congestion, no sore throat Cardiovascular:  no chest pain Respiratory:  no shortness of breath Gastrointestinal:  no abdominal pain, no change in bowel habits GU:  Male: negative for dysuria, frequency, and incontinence Musculoskeletal/Extremities: +R shoulder pain; otherwise no new pain of the joints Integumentary (Skin/Breast):  no abnormal skin lesions reported Neurologic:  no headaches Endocrine: No unexpected weight changes Hematologic/Lymphatic:  no night sweats  Exam BP 118/72 (BP Location: Left Arm, Patient Position: Sitting, Cuff Size: Normal)   Pulse 77   Temp 98.1 F (36.7 C) (Oral)   Ht 5\' 6"  (1.676 m)   Wt 153 lb 4 oz (69.5 kg)   SpO2 99%   BMI 24.74 kg/m  General:  well developed, well nourished, in no apparent distress Skin:  no significant moles, warts, or growths Head:  no masses, lesions, or tenderness Eyes:  pupils equal and round, sclera anicteric without injection Ears:  canals without lesions, TMs shiny without retraction, no obvious effusion, no erythema Nose:  nares patent, mucosa normal Throat/Pharynx:  lips and gingiva without lesion; tongue and uvula midline; non-inflamed pharynx; no exudates or postnasal drainage Neck: neck supple without adenopathy, thyromegaly, or masses Lungs:  clear to auscultation, breath sounds equal bilaterally, no respiratory distress Cardio:  regular rate and rhythm, no bruits, no LE edema Abdomen:  abdomen soft, nontender; bowel sounds normal; no masses or organomegaly Rectal: Deferred Musculoskeletal:  symmetrical muscle groups noted without atrophy or deformity Extremities:  no clubbing, cyanosis, or edema, no deformities, no skin discoloration Neuro:  gait normal; deep tendon reflexes normal and symmetric Psych: well oriented with normal range of affect and appropriate judgment/insight  Assessment and  Plan  Well adult exam - Plan: CBC, Comprehensive metabolic panel, Lipid panel  Prediabetes - Plan: Hemoglobin A1c  Vitamin D insufficiency - Plan: VITAMIN D 25 Hydroxy (Vit-D Deficiency, Fractures)  B12 deficiency - Plan: B12  GAD (generalized anxiety disorder) - Plan: venlafaxine XR (EFFEXOR XR) 37.5 MG 24 hr capsule   Well 42 y.o. male. Counseled on diet and exercise. Advanced directive form requested today.  Other orders as above. GAD: Chronic, uncontrolled. Add Effexor XR 37.5 mg/d. Failed Cymbalta, various SSRI's and Elavil.  Follow up in 1 mo. The patient voiced understanding and agreement to the plan.  Rosalia, DO 02/14/22 10:12 AM

## 2022-02-14 NOTE — Patient Instructions (Signed)
Give us 2-3 business days to get the results of your labs back.   Keep the diet clean and stay active.  Please get me a copy of your advanced directive form at your convenience.   Let us know if you need anything.  

## 2022-02-15 ENCOUNTER — Other Ambulatory Visit: Payer: Self-pay | Admitting: Family Medicine

## 2022-02-15 MED ORDER — VITAMIN D (ERGOCALCIFEROL) 1.25 MG (50000 UNIT) PO CAPS: 50000.0000 [IU] | ORAL_CAPSULE | ORAL | 0 refills | Status: DC

## 2022-02-17 ENCOUNTER — Other Ambulatory Visit: Payer: Self-pay | Admitting: Family Medicine

## 2022-02-17 DIAGNOSIS — E538 Deficiency of other specified B group vitamins: Secondary | ICD-10-CM

## 2022-02-17 DIAGNOSIS — E559 Vitamin D deficiency, unspecified: Secondary | ICD-10-CM

## 2022-03-15 ENCOUNTER — Encounter: Payer: Self-pay | Admitting: Family Medicine

## 2022-04-04 ENCOUNTER — Encounter (HOSPITAL_COMMUNITY): Payer: Self-pay

## 2022-04-04 ENCOUNTER — Other Ambulatory Visit (HOSPITAL_COMMUNITY): Payer: Self-pay | Admitting: Surgery

## 2022-04-04 ENCOUNTER — Encounter: Payer: Self-pay | Admitting: Surgery

## 2022-04-04 ENCOUNTER — Ambulatory Visit (HOSPITAL_COMMUNITY)
Admission: RE | Admit: 2022-04-04 | Discharge: 2022-04-04 | Disposition: A | Discharge status: Home / Self Care | Payer: BC Managed Care – PPO | Source: Ambulatory Visit | Attending: Surgery | Admitting: Surgery

## 2022-04-04 DIAGNOSIS — M4716 Other spondylosis with myelopathy, lumbar region: Secondary | ICD-10-CM

## 2022-04-08 ENCOUNTER — Other Ambulatory Visit: Payer: Self-pay | Admitting: Surgery

## 2022-04-08 ENCOUNTER — Encounter: Payer: Self-pay | Admitting: Surgery

## 2022-04-08 DIAGNOSIS — M4716 Other spondylosis with myelopathy, lumbar region: Secondary | ICD-10-CM

## 2022-04-11 ENCOUNTER — Ambulatory Visit
Admission: RE | Admit: 2022-04-11 | Discharge: 2022-04-11 | Disposition: A | Discharge status: Home / Self Care | Payer: BC Managed Care – PPO | Source: Ambulatory Visit | Attending: Surgery | Admitting: Surgery

## 2022-04-11 ENCOUNTER — Encounter: Payer: Self-pay | Admitting: Family Medicine

## 2022-04-11 DIAGNOSIS — M4716 Other spondylosis with myelopathy, lumbar region: Secondary | ICD-10-CM

## 2022-05-19 ENCOUNTER — Other Ambulatory Visit: Payer: BC Managed Care – PPO

## 2022-05-19 ENCOUNTER — Other Ambulatory Visit (INDEPENDENT_AMBULATORY_CARE_PROVIDER_SITE_OTHER): Payer: BC Managed Care – PPO

## 2022-05-19 ENCOUNTER — Other Ambulatory Visit: Payer: Self-pay | Admitting: Family Medicine

## 2022-05-19 DIAGNOSIS — E538 Deficiency of other specified B group vitamins: Secondary | ICD-10-CM

## 2022-05-19 DIAGNOSIS — E559 Vitamin D deficiency, unspecified: Secondary | ICD-10-CM | POA: Diagnosis not present

## 2022-05-19 LAB — VITAMIN B12: Vitamin B-12: 197 pg/mL — ABNORMAL LOW (ref 211–911)

## 2022-05-19 LAB — VITAMIN D 25 HYDROXY (VIT D DEFICIENCY, FRACTURES): VITD: 17.94 ng/mL — ABNORMAL LOW (ref 30.00–100.00)

## 2022-05-19 MED ORDER — CYANOCOBALAMIN 1000 MCG/ML IJ SOLN: 1000.0000 ug | INTRAMUSCULAR | 0 refills | Status: DC

## 2022-05-19 MED ORDER — VITAMIN D (ERGOCALCIFEROL) 1.25 MG (50000 UNIT) PO CAPS: 50000.0000 [IU] | ORAL_CAPSULE | ORAL | 3 refills | Status: DC

## 2022-05-21 ENCOUNTER — Ambulatory Visit: Payer: BC Managed Care – PPO | Admitting: Family Medicine

## 2022-06-03 ENCOUNTER — Telehealth: Payer: Self-pay

## 2022-06-03 ENCOUNTER — Ambulatory Visit (INDEPENDENT_AMBULATORY_CARE_PROVIDER_SITE_OTHER): Payer: BC Managed Care – PPO | Admitting: Family Medicine

## 2022-06-03 ENCOUNTER — Encounter: Payer: Self-pay | Admitting: Family Medicine

## 2022-06-03 ENCOUNTER — Other Ambulatory Visit: Payer: Self-pay | Admitting: Family Medicine

## 2022-06-03 VITALS — BP 112/62 | HR 72 | Temp 98.0°F | Ht 66.0 in | Wt 144.2 lb

## 2022-06-03 DIAGNOSIS — M5416 Radiculopathy, lumbar region: Secondary | ICD-10-CM | POA: Diagnosis not present

## 2022-06-03 DIAGNOSIS — R41840 Attention and concentration deficit: Secondary | ICD-10-CM

## 2022-06-03 MED ORDER — CARISOPRODOL 250 MG PO TABS: 250.0000 mg | ORAL_TABLET | Freq: Every day | ORAL | 5 refills | Status: DC

## 2022-06-03 MED ORDER — SOMA 250 MG PO TABS: 250.0000 mg | ORAL_TABLET | Freq: Every day | ORAL | 3 refills | Status: DC

## 2022-06-03 MED ORDER — ATOMOXETINE HCL 40 MG PO CAPS: 40.0000 mg | ORAL_CAPSULE | Freq: Every day | ORAL | 2 refills | Status: DC

## 2022-06-03 MED ORDER — PREGABALIN 75 MG PO CAPS: ORAL_CAPSULE | ORAL | 2 refills | Status: DC

## 2022-06-03 MED ORDER — VITAMIN D (ERGOCALCIFEROL) 1.25 MG (50000 UNIT) PO CAPS: ORAL_CAPSULE | ORAL | 1 refills | Status: DC

## 2022-06-03 MED ORDER — HYDROXYZINE PAMOATE 50 MG PO CAPS: 50.0000 mg | ORAL_CAPSULE | Freq: Every day | ORAL | 2 refills | Status: DC

## 2022-06-03 NOTE — Progress Notes (Signed)
Chief Complaint  Patient presents with   discuss surgery    Subjective: Patient is a 42 y.o. male here for possible surgery.  Pt has been following with the [facility] team in [place]. He saw them on 04/21/22 most recently and was dx'd w lumbar radiculopathy.  They told him he needs surgery.  He has been working with physical therapy for the past 3 months without relief.  He is wanting my opinion on this.  He wants to know if seeing a chiropractor would be beneficial.  MRI of the lumbar spine from 2 months ago showed a broad-based disc herniation to the left lateral recess causing stenosis and his left lower extremity pain/itching.  He has been using Soma, Lyrica, and Vistaril to help with his symptoms.  He cannot stand for long periods of time.  He is leaking some urine more than usual as well.  No bowel incontinence.  He is also been having difficulty concentrating.  He is currently taking venlafaxine 37.5 mg daily.  He does not know if this is helpful or not.  He is interested in nonstimulant medication to help with his attention.  Past Medical History:  Diagnosis Date   Cervical stenosis of spinal canal    GERD (gastroesophageal reflux disease)    Major depression, recurrent (HCC)     Objective: BP 112/62 (BP Location: Left Arm, Patient Position: Sitting, Cuff Size: Normal)   Pulse 72   Temp 98 F (36.7 C) (Oral)   Ht 5\' 6"  (1.676 m)   Wt 144 lb 4 oz (65.4 kg)   SpO2 98%   BMI 23.28 kg/m  General: Awake, appears stated age Neuro: Antalgic gait.  DTRs equal and symmetric in the lower extremities without clonus or cerebellar signs.  4/5 strength with hip flexion, knee extension, knee flexion on the left, 5/5 strength in the right lower extremity. MSK: TTP in the lumbar paraspinal musculature and midline, no TTP over the SI joints Lungs: No accessory muscle use Psych: Age appropriate judgment and insight, normal affect and mood  Assessment and Plan: Lumbar  radiculopathy, chronic - Plan: Ambulatory referral to Physical Therapy  Chronic, not controlled.  Refer to physical therapy.  Continue Soma 250 mg daily, Lyrica as 75 mg in the morning, 150 mg in the evening, Vistaril 50 mg in the evening.  Continue with home exercise program, yoga, and massage.  Heat, ice, Tylenol.  I do recommend following the surgeons recommendations in this situation.  I am not certain that chiropractic care would be beneficial in this situation. Chronic, uncontrolled.  Start Strattera 40 mg daily.  Continue venlafaxine 37.5 mg daily for now.  Follow-up in 1 month recheck this. The patient voiced understanding and agreement to the plan.  Jilda Roche Perry, DO 06/03/22  10:15 AM

## 2022-06-03 NOTE — Telephone Encounter (Signed)
Pt's plan doesn't cover brand name Soma, but they do cover generic CARISOPRODOL TAB 250 MG. Can you resend for generic please?

## 2022-06-03 NOTE — Patient Instructions (Signed)
If you do not hear anything about your referral in the next 1-2 weeks, call our office and ask for an update.  See what the physical therapy team recommends for any ambulatory aid.   Let us know if you need anything.

## 2023-03-16 ENCOUNTER — Ambulatory Visit (INDEPENDENT_AMBULATORY_CARE_PROVIDER_SITE_OTHER): Payer: BC Managed Care – PPO | Admitting: Family Medicine

## 2023-03-16 ENCOUNTER — Ambulatory Visit: Payer: BC Managed Care – PPO

## 2023-03-16 VITALS — BP 118/64 | HR 82 | Temp 98.0°F | Resp 16 | Ht 66.0 in | Wt 157.0 lb

## 2023-03-16 DIAGNOSIS — M25561 Pain in right knee: Secondary | ICD-10-CM | POA: Diagnosis not present

## 2023-03-16 DIAGNOSIS — E538 Deficiency of other specified B group vitamins: Secondary | ICD-10-CM

## 2023-03-16 DIAGNOSIS — G8929 Other chronic pain: Secondary | ICD-10-CM | POA: Insufficient documentation

## 2023-03-16 DIAGNOSIS — M25562 Pain in left knee: Secondary | ICD-10-CM

## 2023-03-16 DIAGNOSIS — R052 Subacute cough: Secondary | ICD-10-CM

## 2023-03-16 DIAGNOSIS — E559 Vitamin D deficiency, unspecified: Secondary | ICD-10-CM

## 2023-03-16 DIAGNOSIS — Z0000 Encounter for general adult medical examination without abnormal findings: Secondary | ICD-10-CM | POA: Diagnosis not present

## 2023-03-16 DIAGNOSIS — R7303 Prediabetes: Secondary | ICD-10-CM | POA: Diagnosis not present

## 2023-03-16 DIAGNOSIS — R7611 Nonspecific reaction to tuberculin skin test without active tuberculosis: Secondary | ICD-10-CM

## 2023-03-16 DIAGNOSIS — R059 Cough, unspecified: Secondary | ICD-10-CM

## 2023-03-16 DIAGNOSIS — Z1211 Encounter for screening for malignant neoplasm of colon: Secondary | ICD-10-CM

## 2023-03-16 NOTE — Patient Instructions (Addendum)
 Give [place] 2-3 business days to get the results of your labs back.   Keep the diet clean and stay active.  Please get me a copy of your advanced directive form at your convenience.   Please get your X-ray done at the [facility] in [place]: [address] [place], [place] [address] [phone]  You do not need an appointment for this location.   If you do not hear anything about your referral in the next 1-2 weeks, call our office and ask for an update.  Let us know if you need anything.  Knee Exercises It is normal to feel mild stretching, pulling, tightness, or discomfort as you do these exercises, but you should stop right away if you feel sudden pain or your pain gets worse.  STRETCHING AND RANGE OF MOTION EXERCISES  These exercises warm up your muscles and joints and improve the movement and flexibility of your knee. These exercises also help to relieve pain, numbness, and tingling. Exercise A: Knee Extension, Prone  Lie on your abdomen on a bed. Place your left / right knee just beyond the edge of the surface so your knee is not on the bed. You can put a towel under your left / right thigh just above your knee for comfort. Relax your leg muscles and allow gravity to straighten your knee. You should feel a stretch behind your left / right knee. Hold this position for 30 seconds. Scoot up so your knee is supported between repetitions. Repeat 2 times. Complete this stretch 3 times per week. Exercise B: Knee Flexion, Active     Lie on your back with both knees straight. If this causes back discomfort, bend your left / right knee so your foot is flat on the floor. Slowly slide your left / right heel back toward your buttocks until you feel a gentle stretch in the front of your knee or thigh. Hold this position for 30 seconds. Slowly slide your left / right heel back to the starting position. Repeat 2 times. Complete this exercise 3 times per week. Exercise C: Quadriceps,  Prone     Lie on your abdomen on a firm surface, such as a bed or padded floor. Bend your left / right knee and hold your ankle. If you cannot reach your ankle or pant leg, loop a belt around your foot and grab the belt instead. Gently pull your heel toward your buttocks. Your knee should not slide out to the side. You should feel a stretch in the front of your thigh and knee. Hold this position for 30 seconds. Repeat 2 times. Complete this stretch 3 times per week. Exercise D: Hamstring, Supine  Lie on your back. Loop a belt or towel over the ball of your left / right foot. The ball of your foot is on the walking surface, right under your toes. Straighten your left / right knee and slowly pull on the belt to raise your leg until you feel a gentle stretch behind your knee. Do not let your left / right knee bend while you do this. Keep your other leg flat on the floor. Hold this position for 30 seconds. Repeat 2 times. Complete this stretch 3 times per week. STRENGTHENING EXERCISES  These exercises build strength and endurance in your knee. Endurance is the ability to use your muscles for a long time, even after they get tired. Exercise E: Quadriceps, Isometric     Lie on your back with your left /  right leg extended and your other knee bent. Put a rolled towel or small pillow under your knee if told by your health care provider. Slowly tense the muscles in the front of your left / right thigh. You should see your kneecap slide up toward your hip or see increased dimpling just above the knee. This motion will push the back of the knee toward the floor. For 3 seconds, keep the muscle as tight as you can without increasing your pain. Relax the muscles slowly and completely. Repeat for 10 total reps Repeat 2 ti mes. Complete this exercise 3 times per week. Exercise F: Straight Leg Raises - Quadriceps  Lie on your back with your left / right leg extended and your other knee bent. Tense the  muscles in the front of your left / right thigh. You should see your kneecap slide up or see increased dimpling just above the knee. Your thigh may even shake a bit. Keep these muscles tight as you raise your leg 4-6 inches (10-15 cm) off the floor. Do not let your knee bend. Hold this position for 3 seconds. Keep these muscles tense as you lower your leg. Relax your muscles slowly and completely after each repetition. 10 total reps. Repeat 2 times. Complete this exercise 3 times per week.  Exercise G: Hamstring Curls     If told by your health care provider, do this exercise while wearing ankle weights. Begin with 5 lb weights (optional). Then increase the weight by 1 lb (0.5 kg) increments. Do not wear ankle weights that are more than 20 lbs to start with. Lie on your abdomen with your legs straight. Bend your left / right knee as far as you can without feeling pain. Keep your hips flat against the floor. Hold this position for 3 seconds. Slowly lower your leg to the starting position. Repeat for 10 reps.  Repeat 2 times. Complete this exercise 3 times per week. Exercise H: Squats (Quadriceps)  Stand in front of a table, with your feet and knees pointing straight ahead. You may rest your hands on the table for balance but not for support. Slowly bend your knees and lower your hips like you are going to sit in a chair. Keep your weight over your heels, not over your toes. Keep your lower legs upright so they are parallel with the table legs. Do not let your hips go lower than your knees. Do not bend lower than told by your health care provider. If your knee pain increases, do not bend as low. Hold the squat position for 1 second. Slowly push with your legs to return to standing. Do not use your hands to pull yourself to standing. Repeat 2 times. Complete this exercise 3 times per week. Exercise I: Wall Slides (Quadriceps)     Lean your back against a smooth wall or door while you walk  your feet out 18-24 inches (46-61 cm) from it. Place your feet hip-width apart. Slowly slide down the wall or door until your knees Repeat 2 times. Complete this exercise every other day. Exercise K: Straight Leg Raises - Hip Abductors  Lie on your side with your left / right leg in the top position. Lie so your head, shoulder, knee, and hip line up. You may bend your bottom knee to help you keep your balance. Roll your hips slightly forward so your hips are stacked directly over each other and your left / right knee is facing forward. Leading with your  heel, lift your top leg 4-6 inches (10-15 cm). You should feel the muscles in your outer hip lifting. Do not let your foot drift forward. Do not let your knee roll toward the ceiling. Hold this position for 3 seconds. Slowly return your leg to the starting position. Let your muscles relax completely after each repetition. 10 total reps. Repeat 2 times. Complete this exercise 3 times per week. Exercise J: Straight Leg Raises - Hip Extensors  Lie on your abdomen on a firm surface. You can put a pillow under your hips if that is more comfortable. Tense the muscles in your buttocks and lift your left / right leg about 4-6 inches (10-15 cm). Keep your knee straight as you lift your leg. Hold this position for 3 seconds. Slowly lower your leg to the starting position. Let your leg relax completely after each repetition. Repeat 2 times. Complete this exercise 3 times per week. Document Released: 11/20/2004 Document Revised: 10/01/2015 Document Reviewed: 11/12/2014 Elsevier Interactive Patient Education  2017 [employer].

## 2023-03-16 NOTE — Progress Notes (Signed)
 Chief Complaint  Patient presents with   Annual Exam    Annual Exam     Well Male James Sullivan is here for a complete physical.   His last physical was >1 year ago.  Current diet: in general, a "healthy" diet.   Current exercise: yoga Weight trend: stable Fatigue out of ordinary? No. Seat belt? Yes.   Advanced directive? No  Health maintenance Tetanus- Yes HIV- Yes Hep C- Yes  2 weeks ago, pt was tx'd for pneumonia with doxycycline.  He had a productive cough with colored sputum.  Now he has a cough with clear-colored sputum.  No associated wheezing, shortness of breath, pain, fevers.  He is not taking anything any longer at home.  He does not smoke.  He does have a history of reflux but that is overall controlled.  Cough is not associated with meals or position.  He sometimes has a runny nose.  Over the last several months, close 2-year, he has had bilateral knee pain, worse on the left.  No injury or change in activity.  He denies any swelling, bruising, redness, decreased range of motion, catching/locking.  He has not tried anything at home so far.  He does not stretch routinely anymore.  No neurologic signs or symptoms.  Past Medical History:  Diagnosis Date   Cervical stenosis of spinal canal    GERD (gastroesophageal reflux disease)    Major depression, recurrent (HCC)      Past Surgical History:  Procedure Laterality Date   ANTERIOR CERVICAL DECOMP/DISCECTOMY FUSION  12/2016    Medications  Current Outpatient Medications on File Prior to Visit  Medication Sig Dispense Refill   cyanocobalamin (VITAMIN B12) 1000 MCG/ML injection Inject 1 mL (1,000 mcg total) into the muscle every 30 (thirty) days. 10 mL 0   hydrOXYzine (VISTARIL) 50 MG capsule Take 1 capsule (50 mg total) by mouth at bedtime. 30 capsule 2   omeprazole (PRILOSEC) 40 MG capsule Take 40 mg by mouth daily.     pregabalin (LYRICA) 75 MG capsule Take 75 mg in the morning and 150 mg in the  evening. 90 capsule 2   SYRINGE-NEEDLE, DISP, 3 ML 23G X 1" 3 ML MISC To use with Vitamin B12 injection 50 each 0   Vitamin D, Ergocalciferol, (DRISDOL) 1.25 MG (50000 UNIT) CAPS capsule Take 1 capsule twice weekly. 24 capsule 1     Allergies No Known Allergies  Family History Family History  Problem Relation Age of Onset   Narcolepsy Mother    Anxiety disorder Mother    Depression Mother    Early death Father     Review of Systems: Constitutional: no fevers or chills Eye:  no recent significant change in vision Ear/Nose/Mouth/Throat:  Ears:  no hearing loss Nose/Mouth/Throat:  no complaints of nasal congestion, no sore throat Cardiovascular:  no chest pain Respiratory:  no shortness of breath Gastrointestinal:  no abdominal pain, no change in bowel habits GU:  Male: negative for dysuria, frequency, and incontinence Musculoskeletal/Extremities: + Knee pain Integumentary (Skin/Breast):  no abnormal skin lesions reported Neurologic:  no headaches Endocrine: No unexpected weight changes Hematologic/Lymphatic:  no night sweats  Exam BP 118/64 (BP Location: Left Arm, Patient Position: Sitting)   Pulse 82   Temp 98 F (36.7 C) (Oral)   Resp 16   Ht 5\' 6"  (1.676 m)   Wt 157 lb (71.2 kg)   SpO2 98%   BMI 25.34 kg/m  General:  well developed, well nourished, in no  apparent distress Skin:  no significant moles, warts, or growths Head:  no masses, lesions, or tenderness Eyes:  pupils equal and round, sclera anicteric without injection Ears:  canals without lesions, TMs shiny without retraction, no obvious effusion, no erythema Nose:  nares patent, mucosa normal Throat/Pharynx:  lips and gingiva without lesion; tongue and uvula midline; non-inflamed pharynx; no exudates or postnasal drainage Neck: neck supple without adenopathy, thyromegaly, or masses Lungs:  clear to auscultation, breath sounds equal bilaterally, no respiratory distress Cardio:  regular rate and rhythm, no  bruits, no LE edema Abdomen:  abdomen soft, nontender; bowel sounds normal; no masses or organomegaly Rectal: Deferred Musculoskeletal: No TTP or deformity of either knee; there is no deformity or edema.  Negative Lachman's, McMurray's, varus/valgus stress, patellar apprehension/grind bilaterally.  With flexion/extension of his knee, the left patella mal-tracks reproducing his pain.  Symmetrical muscle groups noted without atrophy or deformity Extremities:  no clubbing, cyanosis, or edema, no deformities, no skin discoloration Neuro:  gait normal; deep tendon reflexes normal and symmetric Psych: well oriented with normal range of affect and appropriate judgment/insight  Assessment and Plan  Well adult exam - Plan: CBC, Comprehensive metabolic panel, Lipid panel  Prediabetes - Plan: Hemoglobin A1c  Vitamin D insufficiency - Plan: VITAMIN D 25 Hydroxy (Vit-D Deficiency, Fractures)  B12 deficiency - Plan: B12  Chronic pain of both knees  Subacute cough - Plan: DG Chest 2 View, CANCELED: DG Chest 2 View  Screen for colon cancer - Plan: Ambulatory referral to Gastroenterology   Well 43 y.o. male. Counseled on diet and exercise. Advanced directive form provided today.  Will ck CXR for cough, could be post infectious.  Knee pain: Favor patellofemoral syndrome.  Heat, ice, Tylenol, stretches and exercises.  If no improvement in next 3 to 4 weeks, he will send me a message and we will set him up with physical therapy. Cough: seems to be a postinfectious cough.  Check a chest x-ray as he did have a positive TST though he did have the BCG vaccine in [place] as a child.  His work is requiring a chest x-ray.  Unlikely to be related to reflux or COPD. Other orders as above. Follow up in 6 mo pending the above workup. The patient voiced understanding and agreement to the plan.  Jilda Roche [place], DO 03/16/23 2:43 PM

## 2023-03-17 ENCOUNTER — Other Ambulatory Visit: Payer: Self-pay

## 2023-03-17 ENCOUNTER — Encounter: Payer: Self-pay | Admitting: Family Medicine

## 2023-03-17 DIAGNOSIS — E559 Vitamin D deficiency, unspecified: Secondary | ICD-10-CM

## 2023-03-17 LAB — COMPREHENSIVE METABOLIC PANEL
ALT: 23 U/L (ref 0–53)
AST: 24 U/L (ref 0–37)
Albumin: 4.1 g/dL (ref 3.5–5.2)
Alkaline Phosphatase: 47 U/L (ref 39–117)
BUN: 13 mg/dL (ref 6–23)
CO2: 30 meq/L (ref 19–32)
Calcium: 9.2 mg/dL (ref 8.4–10.5)
Chloride: 101 meq/L (ref 96–112)
Creatinine, Ser: 1.07 mg/dL (ref 0.40–1.50)
GFR: 84.24 mL/min (ref >60.00)
Glucose, Bld: 75 mg/dL (ref 70–99)
Potassium: 4.1 meq/L (ref 3.5–5.1)
Sodium: 139 meq/L (ref 135–145)
Total Bilirubin: 1.1 mg/dL (ref 0.2–1.2)
Total Protein: 6.9 g/dL (ref 6.0–8.3)

## 2023-03-17 LAB — LIPID PANEL
Cholesterol: 173 mg/dL (ref 0–200)
HDL: 39.5 mg/dL (ref >39.00)
LDL Cholesterol: 114 mg/dL — ABNORMAL HIGH (ref 0–99)
NonHDL: 133.08
Total CHOL/HDL Ratio: 4
Triglycerides: 97 mg/dL (ref 0.0–149.0)
VLDL: 19.4 mg/dL (ref 0.0–40.0)

## 2023-03-17 LAB — VITAMIN D 25 HYDROXY (VIT D DEFICIENCY, FRACTURES): VITD: 17.48 ng/mL — ABNORMAL LOW (ref 30.00–100.00)

## 2023-03-17 LAB — CBC
HCT: 44 % (ref 39.0–52.0)
Hemoglobin: 14.9 g/dL (ref 13.0–17.0)
MCHC: 33.9 g/dL (ref 30.0–36.0)
MCV: 87 fl (ref 78.0–100.0)
Platelets: 248 10*3/uL (ref 150.0–400.0)
RBC: 5.05 Mil/uL (ref 4.22–5.81)
RDW: 13.2 % (ref 11.5–15.5)
WBC: 5.4 10*3/uL (ref 4.0–10.5)

## 2023-03-17 LAB — HEMOGLOBIN A1C: Hgb A1c MFr Bld: 5.8 % (ref 4.6–6.5)

## 2023-03-17 LAB — VITAMIN B12: Vitamin B-12: 183 pg/mL — ABNORMAL LOW (ref 211–911)

## 2023-03-18 ENCOUNTER — Other Ambulatory Visit: Payer: Self-pay | Admitting: Family Medicine

## 2023-03-18 ENCOUNTER — Telehealth: Payer: Self-pay | Admitting: Family Medicine

## 2023-03-18 ENCOUNTER — Other Ambulatory Visit: Payer: Self-pay

## 2023-03-18 MED ORDER — PREGABALIN 75 MG PO CAPS: ORAL_CAPSULE | ORAL | 2 refills | Status: AC

## 2023-03-18 MED ORDER — PREGABALIN 75 MG PO CAPS: ORAL_CAPSULE | ORAL | 2 refills | Status: DC

## 2023-03-18 MED ORDER — CYANOCOBALAMIN 1000 MCG/ML IJ SOLN: 1000.0000 ug | INTRAMUSCULAR | 0 refills | Status: AC

## 2023-03-18 MED ORDER — "SYRINGE/NEEDLE (DISP) 23G X 1"" 3 ML MISC": 0 refills | Status: AC

## 2023-03-18 MED ORDER — CARISOPRODOL 250 MG PO TABS: 250.0000 mg | ORAL_TABLET | Freq: Every day | ORAL | 5 refills | Status: AC

## 2023-03-18 MED ORDER — VITAMIN D (ERGOCALCIFEROL) 1.25 MG (50000 UNIT) PO CAPS: ORAL_CAPSULE | ORAL | 1 refills | Status: AC

## 2023-03-18 MED ORDER — HYDROXYZINE PAMOATE 50 MG PO CAPS: 50.0000 mg | ORAL_CAPSULE | Freq: Every day | ORAL | 2 refills | Status: AC

## 2023-03-18 NOTE — Telephone Encounter (Signed)
Cervical radiculopathy

## 2023-03-18 NOTE — Telephone Encounter (Signed)
 Note edited for Pharmacy and printed by mistake.

## 2023-03-18 NOTE — Telephone Encounter (Signed)
 Copied from CRM [id number]. Topic: Clinical - Prescription Issue >> Mar 18, 2023 11:29 AM Denese Killings wrote: Reason for CRM: Kaherine with [facility] stated that pharmacy has to increase documenation on all control substances and she needs the diagnosis code or indication on why patient is taking pregabalin (LYRICA) 75 MG capsule.

## 2023-03-19 ENCOUNTER — Encounter: Payer: Self-pay | Admitting: Family Medicine

## 2023-06-08 ENCOUNTER — Other Ambulatory Visit: Payer: BC Managed Care – PPO

## 2024-03-16 ENCOUNTER — Encounter: Admitting: Family Medicine
